# Patient Record
Sex: Female | Born: 1990
Health system: Southern US, Community
[De-identification: ages and names within clinical notes are randomized; demographics above are authoritative.]

## PROBLEM LIST (undated history)

## (undated) ENCOUNTER — Emergency Department (HOSPITAL_BASED_OUTPATIENT_CLINIC_OR_DEPARTMENT_OTHER): Admission: EM | Payer: Medicaid Other | Source: Home / Self Care

## (undated) DIAGNOSIS — I10 Essential (primary) hypertension: Secondary | ICD-10-CM

## (undated) DIAGNOSIS — J302 Other seasonal allergic rhinitis: Secondary | ICD-10-CM

## (undated) DIAGNOSIS — N200 Calculus of kidney: Secondary | ICD-10-CM

## (undated) DIAGNOSIS — E119 Type 2 diabetes mellitus without complications: Secondary | ICD-10-CM

## (undated) HISTORY — PX: DILATION AND CURETTAGE OF UTERUS: SHX78

---
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
## 2010-01-08 ENCOUNTER — Emergency Department (HOSPITAL_BASED_OUTPATIENT_CLINIC_OR_DEPARTMENT_OTHER): Admission: EM | Admit: 2010-01-08 | Discharge: 2010-01-08 | Payer: Self-pay | Admitting: Emergency Medicine

## 2011-03-05 ENCOUNTER — Emergency Department (HOSPITAL_BASED_OUTPATIENT_CLINIC_OR_DEPARTMENT_OTHER)
Admission: EM | Admit: 2011-03-05 | Discharge: 2011-03-05 | Disposition: A | Discharge status: Home / Self Care | Payer: Self-pay | Attending: Emergency Medicine | Admitting: Emergency Medicine

## 2011-03-05 ENCOUNTER — Encounter: Payer: Self-pay | Admitting: *Deleted

## 2011-03-05 ENCOUNTER — Other Ambulatory Visit: Payer: Self-pay

## 2011-03-05 DIAGNOSIS — R079 Chest pain, unspecified: Secondary | ICD-10-CM | POA: Insufficient documentation

## 2011-03-05 DIAGNOSIS — F17200 Nicotine dependence, unspecified, uncomplicated: Secondary | ICD-10-CM | POA: Insufficient documentation

## 2011-03-05 DIAGNOSIS — N898 Other specified noninflammatory disorders of vagina: Secondary | ICD-10-CM | POA: Insufficient documentation

## 2011-03-05 DIAGNOSIS — N939 Abnormal uterine and vaginal bleeding, unspecified: Secondary | ICD-10-CM

## 2011-03-05 LAB — URINALYSIS, ROUTINE W REFLEX MICROSCOPIC
Ketones, ur: NEGATIVE mg/dL
Leukocytes, UA: NEGATIVE
Nitrite: NEGATIVE
Urobilinogen, UA: 1 mg/dL (ref 0.0–1.0)
pH: 6.5 (ref 5.0–8.0)

## 2011-03-05 LAB — COMPREHENSIVE METABOLIC PANEL
Alkaline Phosphatase: 45 U/L (ref 39–117)
BUN: 12 mg/dL (ref 6–23)
Calcium: 9.6 mg/dL (ref 8.4–10.5)
GFR calc Af Amer: >60 mL/min (ref >60)
GFR calc non Af Amer: >60 mL/min (ref >60)
Glucose, Bld: 103 mg/dL — ABNORMAL HIGH (ref 70–99)
Total Protein: 7 g/dL (ref 6.0–8.3)

## 2011-03-05 LAB — DIFFERENTIAL
Eosinophils Absolute: 0.2 10*3/uL (ref 0.0–0.7)
Eosinophils Relative: 3 % (ref 0–5)
Lymphs Abs: 4 10*3/uL (ref 0.7–4.0)
Monocytes Relative: 7 % (ref 3–12)

## 2011-03-05 LAB — CBC
HCT: 32.7 % — ABNORMAL LOW (ref 36.0–46.0)
Hemoglobin: 11.4 g/dL — ABNORMAL LOW (ref 12.0–15.0)
MCH: 30.5 pg (ref 26.0–34.0)
MCV: 87.4 fL (ref 78.0–100.0)
RBC: 3.74 MIL/uL — ABNORMAL LOW (ref 3.87–5.11)

## 2011-03-05 LAB — URINE MICROSCOPIC-ADD ON

## 2011-03-05 NOTE — ED Notes (Signed)
Pt states that she had chest pain on and off for a month. Also has abd pain for a week and is having vaginal bleeding for a month. Pt is on depo-provera that she started a month + one week ago

## 2011-03-05 NOTE — ED Provider Notes (Addendum)
History     Chief Complaint  Patient presents with  . Chest Pain   HPI Comments: Patient states chest pain off and on for a month, worse with smoking.  Also abd pain, vaginal bleeding for a month.  Started after receiving depo shot.  No urinary complaints.   Patient is a 20 y.o. female presenting with chest pain. The history is provided by the patient.  Chest Pain The chest pain began more  than 1 month ago. Chest pain occurs constantly. The chest pain is unchanged. Primary symptoms include abdominal pain. Pertinent negatives for primary symptoms include no fever, no shortness of breath, no cough, no nausea and no vomiting. She tried nothing for the symptoms. Risk factors include smoking/tobacco exposure.     History reviewed. No pertinent past medical history.  History reviewed. No pertinent past surgical history.  No family history on file.  History  Substance Use Topics  . Smoking status: Current Everyday Smoker  . Smokeless tobacco: Not on file  . Alcohol Use:     OB History    Grav Para Term Preterm Abortions TAB SAB Ect Mult Living                  Review of Systems  Constitutional: Negative for fever, chills and activity change.  HENT: Negative.   Respiratory: Negative for cough and shortness of breath.   Cardiovascular: Positive for chest pain.  Gastrointestinal: Positive for abdominal pain. Negative for nausea, vomiting, diarrhea and blood in stool.  Genitourinary: Positive for vaginal bleeding. Negative for dysuria.  All other systems reviewed and are negative.    Physical Exam  BP 135/79  Pulse 81  Temp(Src) 98.6 F (37 C) (Oral)  Resp 18  Ht 6' (1.829 m)  Wt 153 lb (69.4 kg)  BMI 20.75 kg/m2  SpO2 100%  Physical Exam  Constitutional: She is oriented to person, place, and time. She appears well-developed and well-nourished. No distress.  HENT:  Head: Normocephalic and atraumatic.  Neck: Normal range of motion. Neck supple.  Cardiovascular:  Normal rate, regular rhythm and normal heart sounds.  Exam reveals no gallop and no friction rub.   No murmur heard. Pulmonary/Chest: Effort normal and breath sounds normal. No respiratory distress. She has no wheezes. She has no rales. She exhibits no tenderness.  Abdominal: Soft. Bowel sounds are normal. She exhibits no distension. There is no tenderness. There is no rebound.  Musculoskeletal: Normal range of motion.  Neurological: She is alert and oriented to person, place, and time.  Skin: Skin is warm and dry. She is not diaphoretic.    ED Course  Procedures  MDM EKG show NSR @ 77 bpm.  No acute changes.  Labs unremarkable, patient appears very comfortable.  I suspect bleeding is due to the Depo shot.  Will discharge and follow up with primary provider if not improving in the next week.       Geoffery Lyons, MD 03/05/11 1610  Geoffery Lyons, MD 05/05/11 [phone]

## 2011-05-09 LAB — GC/CHLAMYDIA PROBE AMP, GENITAL
Chlamydia, DNA Probe: POSITIVE — AB
GC Probe Amp, Genital: NEGATIVE

## 2011-05-09 LAB — URINALYSIS, ROUTINE W REFLEX MICROSCOPIC
Glucose, UA: NEGATIVE
Ketones, ur: NEGATIVE
Nitrite: NEGATIVE
Specific Gravity, Urine: 1.024
pH: 7.5

## 2011-05-09 LAB — WET PREP, GENITAL
Trich, Wet Prep: NONE SEEN
Yeast Wet Prep HPF POC: NONE SEEN

## 2011-05-20 ENCOUNTER — Emergency Department (HOSPITAL_BASED_OUTPATIENT_CLINIC_OR_DEPARTMENT_OTHER)
Admission: EM | Admit: 2011-05-20 | Discharge: 2011-05-20 | Disposition: A | Discharge status: Home / Self Care | Payer: Self-pay | Attending: Emergency Medicine | Admitting: Emergency Medicine

## 2011-05-20 ENCOUNTER — Encounter (HOSPITAL_BASED_OUTPATIENT_CLINIC_OR_DEPARTMENT_OTHER): Payer: Self-pay | Admitting: *Deleted

## 2011-05-20 DIAGNOSIS — N949 Unspecified condition associated with female genital organs and menstrual cycle: Secondary | ICD-10-CM | POA: Insufficient documentation

## 2011-05-20 DIAGNOSIS — N938 Other specified abnormal uterine and vaginal bleeding: Secondary | ICD-10-CM | POA: Insufficient documentation

## 2011-05-20 DIAGNOSIS — R079 Chest pain, unspecified: Secondary | ICD-10-CM | POA: Insufficient documentation

## 2011-05-20 LAB — URINE MICROSCOPIC-ADD ON

## 2011-05-20 LAB — URINALYSIS, ROUTINE W REFLEX MICROSCOPIC
Bilirubin Urine: NEGATIVE
Glucose, UA: NEGATIVE mg/dL
Ketones, ur: NEGATIVE mg/dL
Protein, ur: NEGATIVE mg/dL
pH: 7.5 (ref 5.0–8.0)

## 2011-05-20 NOTE — ED Provider Notes (Signed)
History     CSN: [id number] Arrival date & time: 05/20/2011 10:05 AM  Chief Complaint  Patient presents with  . Vaginal Bleeding    (Consider location/radiation/quality/duration/timing/severity/associated sxs/prior treatment) HPI Comments: Patient comes in today complaining of vaginal bleeding which is just light spotting over the last 3 months. She states she previously been receiving her gynecologic care at the [facility]. They had her on Depo-Provera until patient decided she did not want to take that anymore. Since stopping that she is still not had a regular period. Patient denies other vaginal discharge, fevers, nausea, vomiting, dysuria. She states that she's not currently sexually active. Patient also states she's had about 2 weeks of left-sided chest pain. It is not associated with any shortness of breath. It is not pleuritic in nature. Is not changed with ED and or associated with any nausea or vomiting or fevers. She's had no trauma.  Patient is a 20 y.o. female presenting with vaginal bleeding. The history is provided by the patient. No language interpreter was used.  Vaginal Bleeding This is a chronic problem. The problem occurs constantly. The problem has not changed since onset.Associated symptoms include chest pain. Pertinent negatives include no abdominal pain, no headaches and no shortness of breath. The symptoms are aggravated by nothing. The symptoms are relieved by nothing.    History reviewed. No pertinent past medical history.  History reviewed. No pertinent past surgical history.  No family history on file.  History  Substance Use Topics  . Smoking status: Current Everyday Smoker  . Smokeless tobacco: Not on file  . Alcohol Use: No    OB History    Grav Para Term Preterm Abortions TAB SAB Ect Mult Living                  Review of Systems  Constitutional: Negative.  Negative for fever and chills.  HENT: Negative.   Eyes: Negative.   Negative for discharge and redness.  Respiratory: Negative.  Negative for cough and shortness of breath.   Cardiovascular: Positive for chest pain.  Gastrointestinal: Negative.  Negative for nausea, vomiting, abdominal pain and diarrhea.  Genitourinary: Positive for vaginal bleeding. Negative for dysuria and vaginal discharge.  Musculoskeletal: Negative.  Negative for back pain.  Skin: Negative.  Negative for color change and rash.  Neurological: Negative.  Negative for syncope and headaches.  Hematological: Negative.  Negative for adenopathy.  Psychiatric/Behavioral: Negative.  Negative for confusion.  All other systems reviewed and are negative.    Allergies  Review of patient's allergies indicates no known allergies.  Home Medications  No current outpatient prescriptions on file.  BP 112/70  Pulse 72  Temp(Src) 99.1 F (37.3 C) (Oral)  Resp 18  SpO2 100%  Physical Exam  Constitutional: She is oriented to person, place, and time. She appears well-developed and well-nourished.  Non-toxic appearance. She does not have a sickly appearance.  HENT:  Head: Normocephalic and atraumatic.  Eyes: Conjunctivae, EOM and lids are normal. Pupils are equal, round, and reactive to light. No scleral icterus.  Neck: Trachea normal and normal range of motion. Neck supple.  Cardiovascular: Normal rate, regular rhythm and normal heart sounds.   Pulmonary/Chest: Effort normal and breath sounds normal. No respiratory distress. She has no wheezes. She has no rales. She exhibits no tenderness.  Abdominal: Soft. Normal appearance. There is no tenderness. There is no rebound, no guarding and no CVA tenderness.  Musculoskeletal: Normal range of motion.  Neurological: She is alert  and oriented to person, place, and time. She has normal strength.  Skin: Skin is warm, dry and intact. No rash noted.  Psychiatric: She has a normal mood and affect. Her behavior is normal. Judgment and thought content normal.      ED Course  Procedures (including critical care time)  Labs Reviewed  URINALYSIS, ROUTINE W REFLEX MICROSCOPIC - Abnormal; Notable for the following:    Hgb urine dipstick SMALL (*)    All other components within normal limits  URINE MICROSCOPIC-ADD ON - Abnormal; Notable for the following:    Squamous Epithelial / LPF FEW (*)    Bacteria, UA MANY (*)    All other components within normal limits  PREGNANCY, URINE   No results found.   No diagnosis found.    MDM  Patient with vaginal spotting over the last several months. She does not appear clinically anemic at this point in time. She denies any abdominal pain or vaginal discharge. States she's not currently sexually active and is not concerned that she has any sexual transmitted infections. She previously been on Depo-Provera and is now not had that in several months. Patient has not follow with a gynecologist but he previously been getting treated through the [facility]. Patient has been advised that she needs to follow back up with a gynecologist to be in a private office or the health department for her persistent bleeding and to determine if she wants to go back on some form of birth control. She's also been advised of the importance of annual Pap smears for cervical cancer screening. She otherwise appears well and does not have a positive pregnancy test. She declines a pelvic exam will be discharged home. She has no urinary symptoms at this point in time to indicate that she has significant UTI.        Nat Christen, MD 05/20/11 [phone]

## 2011-05-20 NOTE — ED Notes (Signed)
Patient states that she stopped her depo shots back in July and continues to have intermittent vaginal bleeding. Also c/o muscular pain under L breast, no injury

## 2011-05-20 NOTE — ED Notes (Signed)
Patient left after talking with MD and informed she was not pregnant & had request pregnancy test, no c/o pain prior to leaving

## 2012-10-22 ENCOUNTER — Encounter (HOSPITAL_BASED_OUTPATIENT_CLINIC_OR_DEPARTMENT_OTHER): Payer: Self-pay | Admitting: Emergency Medicine

## 2012-10-22 ENCOUNTER — Emergency Department (HOSPITAL_BASED_OUTPATIENT_CLINIC_OR_DEPARTMENT_OTHER)
Admission: EM | Admit: 2012-10-22 | Discharge: 2012-10-22 | Disposition: A | Discharge status: Home / Self Care | Payer: Self-pay | Attending: Emergency Medicine | Admitting: Emergency Medicine

## 2012-10-22 DIAGNOSIS — R42 Dizziness and giddiness: Secondary | ICD-10-CM | POA: Insufficient documentation

## 2012-10-22 DIAGNOSIS — Z3202 Encounter for pregnancy test, result negative: Secondary | ICD-10-CM | POA: Insufficient documentation

## 2012-10-22 DIAGNOSIS — R109 Unspecified abdominal pain: Secondary | ICD-10-CM | POA: Insufficient documentation

## 2012-10-22 DIAGNOSIS — R110 Nausea: Secondary | ICD-10-CM | POA: Insufficient documentation

## 2012-10-22 DIAGNOSIS — F17200 Nicotine dependence, unspecified, uncomplicated: Secondary | ICD-10-CM | POA: Insufficient documentation

## 2012-10-22 LAB — CBC WITH DIFFERENTIAL/PLATELET
Basophils Relative: 0 % (ref 0–1)
HCT: 34.3 % — ABNORMAL LOW (ref 36.0–46.0)
Hemoglobin: 11.8 g/dL — ABNORMAL LOW (ref 12.0–15.0)
Lymphs Abs: 3.1 10*3/uL (ref 0.7–4.0)
MCH: 30 pg (ref 26.0–34.0)
MCHC: 34.4 g/dL (ref 30.0–36.0)
Monocytes Absolute: 0.3 10*3/uL (ref 0.1–1.0)
Monocytes Relative: 7 % (ref 3–12)
Neutro Abs: 1.5 10*3/uL — ABNORMAL LOW (ref 1.7–7.7)
Neutrophils Relative %: 30 % — ABNORMAL LOW (ref 43–77)
RBC: 3.93 MIL/uL (ref 3.87–5.11)

## 2012-10-22 LAB — PREGNANCY, URINE: Preg Test, Ur: NEGATIVE

## 2012-10-22 LAB — URINALYSIS, ROUTINE W REFLEX MICROSCOPIC
Bilirubin Urine: NEGATIVE
Ketones, ur: NEGATIVE mg/dL
Nitrite: NEGATIVE
Protein, ur: NEGATIVE mg/dL

## 2012-10-22 LAB — URINE MICROSCOPIC-ADD ON

## 2012-10-22 NOTE — ED Notes (Signed)
D/c home- resources given- no new rx

## 2012-10-22 NOTE — ED Provider Notes (Signed)
History     CSN: [id number]  Arrival date & time 10/22/12  1751   First MD Initiated Contact with Patient 10/22/12 1801      Chief Complaint  Patient presents with  . Nausea  . Dizziness    (Consider location/radiation/quality/duration/timing/severity/associated sxs/prior treatment) HPI Comments: Patient is a 22 y/o F with no significant PMHx c/o nausea x 1 month that is intermittent. Patient reported having episodes of nausea throughout the day - lasting approximately 5-10 minutes with minimal abdominal pain - patient reported that once these episodes of nausea occur she "gags," but nothing comes up. Patient reported being sexually active, with males, last sexual encounter was 3 (three) days ago. Patient denied protection and use of contraception. Patient reported that LMP was September 15, 2012 - denied abnormal menstruation. Denied fever, vomiting, chills, headache, changes to appetite, diarrhea, constipation, recent illness, sick contacts, vaginal discharge, dysuria, dizziness upon change to position.    The history is provided by the patient. No language interpreter was used.    History reviewed. No pertinent past medical history.  History reviewed. No pertinent past surgical history.  No family history on file.  History  Substance Use Topics  . Smoking status: Current Every Day Smoker  . Smokeless tobacco: Not on file  . Alcohol Use: No    OB History   Grav Para Term Preterm Abortions TAB SAB Ect Mult Living                  Review of Systems  Constitutional: Negative for fever.  Gastrointestinal: Positive for nausea.  Genitourinary: Negative for vaginal discharge.  Neurological: Positive for dizziness.  10 Systems reviewed and are negative for acute change except as noted in the HPI.   Allergies  Review of patient's allergies indicates no known allergies.  Home Medications  No current outpatient prescriptions on file.  BP 133/80  Pulse 87  Temp(Src) 98.3  F (36.8 C) (Oral)  Resp 18  Ht 6\' 1"  (1.854 m)  Wt 183 lb (83.008 kg)  BMI 24.15 kg/m2  SpO2 100%  LMP 09/15/2012  Physical Exam  Nursing note and vitals reviewed. Constitutional: She is oriented to person, place, and time. She appears well-developed and well-nourished. No distress.  HENT:  Right Ear: External ear normal.  Left Ear: External ear normal.  Nose: Nose normal.  Mouth/Throat: Oropharynx is clear and moist. No oropharyngeal exudate.  Eyes: Conjunctivae and EOM are normal. Pupils are equal, round, and reactive to light. Right eye exhibits no discharge. Left eye exhibits no discharge.  Neck: Normal range of motion. Neck supple.  Negative lymphadenopathy.  Cardiovascular: Normal rate, regular rhythm, normal heart sounds and intact distal pulses.  Exam reveals no gallop.   No murmur heard. Pulmonary/Chest: Effort normal and breath sounds normal. She has no wheezes. She has no rales.  Abdominal: Soft. Bowel sounds are normal. She exhibits no distension. There is no tenderness. There is no rebound.  Musculoskeletal: Normal range of motion. She exhibits no edema.  Lymphadenopathy:    She has no cervical adenopathy.  Neurological: She is alert and oriented to person, place, and time.  Skin: Skin is warm and dry. No rash noted. She is not diaphoretic. No erythema.  Psychiatric: She has a normal mood and affect. Her behavior is normal.    ED Course  Procedures (including critical care time)  Labs Reviewed  URINALYSIS, ROUTINE W REFLEX MICROSCOPIC - Abnormal; Notable for the following:    Hgb urine dipstick MODERATE (*)  Leukocytes, UA SMALL (*)    All other components within normal limits  CBC WITH DIFFERENTIAL - Abnormal; Notable for the following:    Hemoglobin 11.8 (*)    HCT 34.3 (*)    Neutrophils Relative 30 (*)    Neutro Abs 1.5 (*)    Lymphocytes Relative 61 (*)    All other components within normal limits  PREGNANCY, URINE  URINE MICROSCOPIC-ADD ON    Results for orders placed during the hospital encounter of 10/22/12  PREGNANCY, URINE      Result Value Range   Preg Test, Ur NEGATIVE  NEGATIVE  URINALYSIS, ROUTINE W REFLEX MICROSCOPIC      Result Value Range   Color, Urine YELLOW  YELLOW   APPearance CLEAR  CLEAR   Specific Gravity, Urine 1.023  1.005 - 1.030   pH 6.5  5.0 - 8.0   Glucose, UA NEGATIVE  NEGATIVE mg/dL   Hgb urine dipstick MODERATE (*) NEGATIVE   Bilirubin Urine NEGATIVE  NEGATIVE   Ketones, ur NEGATIVE  NEGATIVE mg/dL   Protein, ur NEGATIVE  NEGATIVE mg/dL   Urobilinogen, UA 1.0  0.0 - 1.0 mg/dL   Nitrite NEGATIVE  NEGATIVE   Leukocytes, UA SMALL (*) NEGATIVE  URINE MICROSCOPIC-ADD ON      Result Value Range   Squamous Epithelial / LPF RARE  RARE   WBC, UA 3-6  <3 WBC/hpf   RBC / HPF 7-10  <3 RBC/hpf   Bacteria, UA RARE  RARE  CBC WITH DIFFERENTIAL      Result Value Range   WBC 5.0  4.0 - 10.5 K/uL   RBC 3.93  3.87 - 5.11 MIL/uL   Hemoglobin 11.8 (*) 12.0 - 15.0 g/dL   HCT 82.9 (*) 56.2 - 13.0 %   MCV 87.3  78.0 - 100.0 fL   MCH 30.0  26.0 - 34.0 pg   MCHC 34.4  30.0 - 36.0 g/dL   RDW 86.5  78.4 - 69.6 %   Platelets 212  150 - 400 K/uL   Neutrophils Relative 30 (*) 43 - 77 %   Neutro Abs 1.5 (*) 1.7 - 7.7 K/uL   Lymphocytes Relative 61 (*) 12 - 46 %   Lymphs Abs 3.1  0.7 - 4.0 K/uL   Monocytes Relative 7  3 - 12 %   Monocytes Absolute 0.3  0.1 - 1.0 K/uL   Eosinophils Relative 2  0 - 5 %   Eosinophils Absolute 0.1  0.0 - 0.7 K/uL   Basophils Relative 0  0 - 1 %   Basophils Absolute 0.0  0.0 - 0.1 K/uL    Filed Vitals:   10/22/12 1756  BP: 133/80  Pulse: 87  Temp: 98.3 F (36.8 C)  Resp: 18     1. Nausea   2. Dizziness       MDM  Patient is a 22 y/o with no significant PMHx c/o nausea x 1 month that is intermittent with episodes that last approximately 5-10 minutes with mild abdominal pain and feeling of gagging. Patient reported being sexually active, last sexual encounter  approximately 3 (three) days ago, denied protection and contraception. Patient reported LMP 09/15/2012, has not had menstruation for this month. Patient denied vomiting, fever, chills, weakness, changes to appetite, changes to bowel movements, vaginal discharge.   I personally evaluated and examined the patient.  PE: Eyes- PERRLA, conjunctiva without erythema, EOMs intact without pain b/l. Ears- TMs present without bulging, fluid accumulation, erythema, TM light reflex b/l.  Lungs- CTA b/l, no wheezes/rales/rhonchi. Cardiac- Regular rate/rhythm, S1/S2 heard, peripheral pulses palpable. Abdomen- Non-distended, BS normoactive, soft, non-tender to all 4 (four) quadrants.  Ordered Urine pregnancy test to r/o pregnancy Ordered UA to r/o UTI Ordered CBC to r/o anemia  Pregnancy test- negative UA- moderate blood, small leukocytes. Negative protein, ketones, nitrites. Reviewed patient's chart, blood found in urine 03/05/2011 Urine microscopic - negative findings CBC- Hgb low (11.8) and Hct low (34.3) - reviewed patient's chart and Hgb and Hct have always been mildly low, 03/05/2011 Hgb 11.7 and Hct 32.7 Results unchanged when compared to previous lab results in patient's chart.  Patient is afebrile, nontachycardic, normotensive, alert, happy affect. Patient aseptic. Negative urine pregnancy test. Discharged patient due to no sign of being septic. Discussed following up with physician, gave patient a list of recommendations for physicians to follow-up with regarding feelings of nausea that are reoccurring, dicussed with patient to follow-up within the week. Recommended over the counter Pepcid for nausea relief if nausea continued. Recommended patient to eat at least three (3) meals a day and to stay hydrated, patient reported only eating two (2) meals a day and mainly drinking soda. Referred patient to [facility], since patient is sexually active and does not have a gynecologist at the present time.  Discussed with patient to use protection during sexual encounters, discussed the different forms and recommended patient to discuss forms of contraception at [facility]. Discussed that if symptoms are to worsen (fver, abdominal pain, changes to bowel movements/appetite, vaginal pain, vaginal discharge) to report back to the ED.   Patient agreed to plan of care, understood, answered all questions.        Raymon Mutton, PA-C 10/22/12 2327

## 2012-10-22 NOTE — ED Notes (Signed)
Nausea approximately 3/7 days over the last month.  Also c/o feeling dizzy.  Denies H/A or CP.

## 2012-10-22 NOTE — ED Provider Notes (Signed)
  Medical screening examination/treatment/procedure(s) were performed by non-physician practitioner and as supervising physician I was immediately available for consultation/collaboration.    Gerhard Munch, MD 10/22/12 2358

## 2014-01-23 ENCOUNTER — Emergency Department (HOSPITAL_BASED_OUTPATIENT_CLINIC_OR_DEPARTMENT_OTHER)
Admission: EM | Admit: 2014-01-23 | Discharge: 2014-01-23 | Disposition: A | Discharge status: Home / Self Care | Payer: Medicaid Other | Attending: Emergency Medicine | Admitting: Emergency Medicine

## 2014-01-23 ENCOUNTER — Encounter (HOSPITAL_BASED_OUTPATIENT_CLINIC_OR_DEPARTMENT_OTHER): Payer: Self-pay | Admitting: Emergency Medicine

## 2014-01-23 DIAGNOSIS — F17200 Nicotine dependence, unspecified, uncomplicated: Secondary | ICD-10-CM | POA: Insufficient documentation

## 2014-01-23 DIAGNOSIS — R109 Unspecified abdominal pain: Secondary | ICD-10-CM

## 2014-01-23 DIAGNOSIS — R110 Nausea: Secondary | ICD-10-CM | POA: Insufficient documentation

## 2014-01-23 DIAGNOSIS — R1013 Epigastric pain: Secondary | ICD-10-CM | POA: Insufficient documentation

## 2014-01-23 DIAGNOSIS — Z3202 Encounter for pregnancy test, result negative: Secondary | ICD-10-CM | POA: Insufficient documentation

## 2014-01-23 DIAGNOSIS — Z79899 Other long term (current) drug therapy: Secondary | ICD-10-CM | POA: Insufficient documentation

## 2014-01-23 LAB — CBC WITH DIFFERENTIAL/PLATELET
BASOS PCT: 0 % (ref 0–1)
Basophils Absolute: 0 10*3/uL (ref 0.0–0.1)
EOS ABS: 0.1 10*3/uL (ref 0.0–0.7)
Eosinophils Relative: 3 % (ref 0–5)
HCT: 36.5 % (ref 36.0–46.0)
Hemoglobin: 12.4 g/dL (ref 12.0–15.0)
Lymphocytes Relative: 60 % — ABNORMAL HIGH (ref 12–46)
Lymphs Abs: 2.4 10*3/uL (ref 0.7–4.0)
MCH: 29.7 pg (ref 26.0–34.0)
MCHC: 34 g/dL (ref 30.0–36.0)
MCV: 87.5 fL (ref 78.0–100.0)
Monocytes Absolute: 0.3 10*3/uL (ref 0.1–1.0)
Monocytes Relative: 8 % (ref 3–12)
NEUTROS PCT: 29 % — AB (ref 43–77)
Neutro Abs: 1.2 10*3/uL — ABNORMAL LOW (ref 1.7–7.7)
PLATELETS: 256 10*3/uL (ref 150–400)
RBC: 4.17 MIL/uL (ref 3.87–5.11)
RDW: 13.5 % (ref 11.5–15.5)
WBC: 4 10*3/uL (ref 4.0–10.5)

## 2014-01-23 LAB — URINE MICROSCOPIC-ADD ON

## 2014-01-23 LAB — PREGNANCY, URINE: Preg Test, Ur: NEGATIVE

## 2014-01-23 LAB — COMPREHENSIVE METABOLIC PANEL
ALBUMIN: 4.3 g/dL (ref 3.5–5.2)
ALK PHOS: 50 U/L (ref 39–117)
ALT: 11 U/L (ref 0–35)
AST: 19 U/L (ref 0–37)
BUN: 8 mg/dL (ref 6–23)
CALCIUM: 9.6 mg/dL (ref 8.4–10.5)
CO2: 26 mEq/L (ref 19–32)
Chloride: 102 mEq/L (ref 96–112)
Creatinine, Ser: 0.9 mg/dL (ref 0.50–1.10)
GFR calc Af Amer: >90 mL/min (ref >90)
GFR calc non Af Amer: 89 mL/min — ABNORMAL LOW (ref >90)
Glucose, Bld: 114 mg/dL — ABNORMAL HIGH (ref 70–99)
POTASSIUM: 4.2 meq/L (ref 3.7–5.3)
SODIUM: 140 meq/L (ref 137–147)
TOTAL PROTEIN: 7.6 g/dL (ref 6.0–8.3)
Total Bilirubin: 0.4 mg/dL (ref 0.3–1.2)

## 2014-01-23 LAB — URINALYSIS, ROUTINE W REFLEX MICROSCOPIC
Bilirubin Urine: NEGATIVE
Glucose, UA: NEGATIVE mg/dL
KETONES UR: NEGATIVE mg/dL
NITRITE: NEGATIVE
PROTEIN: NEGATIVE mg/dL
Specific Gravity, Urine: 1.023 (ref 1.005–1.030)
UROBILINOGEN UA: 1 mg/dL (ref 0.0–1.0)
pH: 6.5 (ref 5.0–8.0)

## 2014-01-23 LAB — LIPASE, BLOOD: LIPASE: 26 U/L (ref 11–59)

## 2014-01-23 MED ORDER — GI COCKTAIL ~~LOC~~
30.0000 mL | Freq: Once | ORAL | Status: AC
  Administered 2014-01-23: 30 mL via ORAL
  Filled 2014-01-23: qty 30

## 2014-01-23 MED ORDER — FAMOTIDINE 20 MG PO TABS: 20.0000 mg | ORAL_TABLET | Freq: Two times a day (BID) | ORAL | Status: DC

## 2014-01-23 MED ORDER — ACETAMINOPHEN 325 MG PO TABS
650.0000 mg | ORAL_TABLET | Freq: Once | ORAL | Status: AC
  Administered 2014-01-23: 650 mg via ORAL
  Filled 2014-01-23: qty 2

## 2014-01-23 NOTE — Discharge Instructions (Signed)
Abdominal (belly) pain can be caused by many things. Your caregiver performed an examination and possibly ordered blood/urine tests and imaging (CT scan, x-rays, ultrasound). Many cases can be observed and treated at home after initial evaluation in the emergency department. Even though you are being discharged home, abdominal pain can be unpredictable. Therefore, you need a repeated exam if your pain does not resolve, returns, or worsens. Most patients with abdominal pain don't have to be admitted to the hospital or have surgery, but serious problems like appendicitis and gallbladder attacks can start out as nonspecific pain. Many abdominal conditions cannot be diagnosed in one visit, so follow-up evaluations are very important. SEEK IMMEDIATE MEDICAL ATTENTION IF: The pain does not go away or becomes severe.  A temperature above 101 develops.  Repeated vomiting occurs (multiple episodes).  The pain becomes localized to portions of the abdomen. The right side could possibly be appendicitis. In an adult, the left lower portion of the abdomen could be colitis or diverticulitis.  Blood is being passed in stools or vomit (bright red or black tarry stools).  Return also if you develop chest pain, difficulty breathing, dizziness or fainting, or become confused, poorly responsive, or inconsolable (young children).   Abdominal Pain Many things can cause belly (abdominal) pain. Most times, the belly pain is not dangerous. Many cases of belly pain can be watched and treated at home. HOME CARE   Do not take medicines that help you go poop (laxatives) unless told to by your doctor.  Only take medicine as told by your doctor.  Eat or drink as told by your doctor. Your doctor will tell you if you should be on a special diet. GET HELP IF:  You do not know what is causing your belly pain.  You have belly pain while you are sick to your stomach (nauseous) or have runny poop (diarrhea).  You have pain while  you pee or poop.  Your belly pain wakes you up at night.  You have belly pain that gets worse or better when you eat.  You have belly pain that gets worse when you eat fatty foods.  You have a fever. GET HELP RIGHT AWAY IF:   The pain does not go away within 2 hours.  You keep throwing up (vomiting).  The pain changes and is only in the right or left part of the belly.  You have bloody or tarry looking poop. MAKE SURE YOU:   Understand these instructions.  Will watch your condition.  Will get help right away if you are not doing well or get worse. Document Released: 01/09/2008 Document Revised: 07/28/2013 Document Reviewed: 04/01/2013 [facility] Patient Information 2015 [employer], [place]. This information is not intended to replace advice given to you by your health care provider. Make sure you discuss any questions you have with your health care provider.  Emergency Department Resource Guide 1) Find a Doctor and Pay Out of Pocket Although you won't have to find out who is covered by your insurance plan, it is a good idea to ask around and get recommendations. You will then need to call the office and see if the doctor you have chosen will accept you as a new patient and what types of options they offer for patients who are self-pay. Some doctors offer discounts or will set up payment plans for their patients who do not have insurance, but you will need to ask so you aren't surprised when you get to your appointment.  2)  Contact Your Local Health Department Not all health departments have doctors that can see patients for sick visits, but many do, so it is worth a call to see if yours does. If you don't know where your local health department is, you can check in your phone book. The CDC also has a tool to help you locate your state's health department, and many state websites also have listings of all of their local health departments.  3) Find a Walk-in Clinic If your illness is not  likely to be very severe or complicated, you may want to try a walk in clinic. These are popping up all over the country in pharmacies, drugstores, and shopping centers. They're usually staffed by nurse practitioners or physician assistants that have been trained to treat common illnesses and complaints. They're usually fairly quick and inexpensive. However, if you have serious medical issues or chronic medical problems, these are probably not your best option.  No Primary Care Doctor: - Call Health Connect at  [phone] - they can help you locate a primary care doctor that  accepts your insurance, provides certain services, etc. - Physician Referral Service- [phone]  Chronic Pain Problems: Organization         Address  Phone   Notes  [facility]  [phone] Patients need to be referred by their primary care doctor.   Medication Assistance: Organization         Address  Phone   Notes  [facility]ication [facility] [address] [place], [place] [address] [phone]) [phone] --Must be a resident of [place] -- Must have NO insurance coverage whatsoever (no Medicaid/ Medicare, etc.) -- The pt. MUST have a primary care doctor that directs their care regularly and follows them in the community   MedAssist  [phone]   [employer]  [phone]    Agencies that provide inexpensive medical care: Organization         Address  Phone   Notes  [facility]  [phone]   [facility]    [phone]   [facility] [address] [place], [place] [address] [phone]) [phone]   [facility] [address], [place] [phone]) [phone]   Planned Parenthood    [phone](336) [phone]   [facility]    [phone]   Community Health and [facility]  [address], [place] Phone:  [phone], Fax:  [phone](336) [phone] Hours of Operation:  9 am - 6 pm,  M-F.  Also accepts Medicaid/Medicare and self-pay.  [facility]  [address], [place] Phone: [phone]) [phone], Fax: [phone]) [phone]. Hours of Operation:  8:30 am - 5:30 pm, M-F.  Also accepts Medicaid and self-pay.  [facility] [place] [address], [place] Phone: [phone]) [phone]   [facility] [address], [place], [place] [phone], Ext. [phone] Mondays & Thursdays: 7-9 AM.  First 15 patients are seen on a first come, first serve basis.    Medicaid-accepting [facility] Providers:  Organization         Address  Phone   Notes  [facility] [address], [place] [phone]) [phone] Also accepts self-pay patients.  [facility] [address], [place]  [phone]   [facility] [address],  [place] [phone]   [place] [address], [place] [phone]   Lucianne Lei [address], [place]   [phone] Only accepts [place] Access [place] patients after they have their name applied to their card.   Self-Pay (no insurance) in [facility]:  Organization         Address  Phone   Notes  Sickle Cell Patients, [facility] [place] [phone]   [facility] [place] [phone]   [facility] [place]  [place], [place], [place] [phone]   [facility]/Dr. Osei-Bonsu  [address] [place], [place] or [address], [place] [phone] Phone number for both [place] and [place] locations is the same.  [facility] [address], [place] [phone]   [facility] [address], [place] or [address] [phone]   [facility] [address], [place] [phone], phone; [phone], fax Sees patients 1st and 3rd Saturday of every month.  Must not qualify for public or private insurance (i.e. Medicaid, Medicare, [place] Health Choice, Veterans' Benefits)  Household income should be no more than 200% of the poverty level The clinic cannot treat you if you are pregnant or think you are pregnant  Sexually transmitted diseases are not treated at the clinic.    Dental Care: Organization         Address  Phone  Notes  [facility] [place] [phone] Accepts children up to age 45 who are enrolled in [place] or [place]; pregnant women with a Medicaid card; and children who have applied for Medicaid or [place] Health Choice, but were declined, whose parents can pay a reduced fee at time of service.  [facility] Department of [facility]  [address], [place] [phone] Accepts children up to age 71 who are enrolled in [place] or [place]; pregnant women with a Medicaid card; and children who have applied for Medicaid or [place] Health Choice, but were declined, whose parents can pay a reduced fee at time of service.  [place] Adult Dental Access PROGRAM  Industry [phone] Patients are seen by appointment only. Walk-ins are not accepted. [place] will see patients 66 years of age and older. Monday - Tuesday (8am-5pm) Most Wednesdays (8:30-5pm) $30 per visit, cash only  [facility] Adult Dental Access PROGRAM  [address], [place] [phone] Patients are seen by appointment only. Walk-ins are not accepted. [place] will see patients 16 years of age and older. One Wednesday Evening (Monthly: Volunteer Based).  $30 per visit, cash only  [place]  [phone] for adults; Children under age 73, call Graduate Pediatric Dentistry at [phone]. Children aged 81-14, please call [phone] to request a pediatric application.  Dental services are provided in all areas of dental care including fillings, crowns and bridges, complete and partial dentures, implants, gum treatment, root canals, and extractions. Preventive care is also provided. Treatment is provided to both adults and children. Patients are selected via a lottery and there is often a waiting list.   [facility] [address], [place]  [phone] [email or web address]   [facility] [address] [place], [place] [phone], Ext. [phone] Second and Fourth Thursday of each month, opens at 6:30 AM; Clinic ends at 9 AM.  Patients are seen on a first-come first-served basis, and a limited number are seen during each clinic.   [facility]  [address] Hillard Danker [place], [place] [phone]   Eligibility Requirements You must have lived in [place], [place], or [place] counties for at least the last three months.   You cannot be eligible for state or federal sponsored [employer], including [employer], [place], or [employer].   You generally cannot be eligible for healthcare insurance through your employer.    How to apply: Eligibility screenings are held every Tuesday and Wednesday afternoon from 1:00 pm until 4:00 pm. You do not need an appointment for the interview!  [facility] [address], [place], [place]   [place]  [place] Department  [place]  [phone]    Behavioral Health Resources in the Community: Intensive Outpatient Programs Organization         Address  Phone  Notes  [place]. [address] [place], [place] [phone]   [facility] Outpatient [address] [place], [place]   ADS: Alcohol & Drug Svcs  [address], [place], [place]   [place] [address],  [place], [place] or [phone]   Substance Abuse Resources Organization         Address  Phone  Notes  Alcohol and Drug Services  [phone]   [place]  [phone]   The [place]   Chinita Pester  [phone]   Residential & Outpatient Substance Abuse Program  [phone]   Psychological Services Organization         Address  Phone  Notes  [facility] [place]  [place]  [phone]   [place] [address], [place] or [phone]    Mobile Crisis Teams Organization         Address  Phone  Notes  [facility]rapeutic [facility]atives, Mobile Crisis Care Unit  [phone]   [facility]otherapeutic Services  [address] [place], [place]   [place] Levels [address] [place] [phone]    Self-Help/Support Groups Organization         Address  Phone             Notes  [place]. of [place] - variety of support groups  [place] Call for more information  Narcotics Anonymous (NA), Caring Services [address], [employer] [place]  2 meetings at this location   Special educational needs teacher         Address  Phone  Notes  [facility]idential [facility] [place],    [place]  [phone]   [facility]  [address] [place] 676720, [place], [place]   [place], [place] [phone] Admissions: 8am-3pm M-F  Incentives Substance [place] [address]    [place], [place] [phone]   [facility] [address] Jadene Pierini [place], [place]   [facility] [address],  [place], [place]   Insight Programs -  Intensive Outpatient [address], [place], [place]  [phone]   [facility] ([place] [place].,  [place], [place] [phone] or [phone]   [facility] (RTS) [address], [place], [place] Accepts Medicaid  [facility] [place] [address],  [place] [phone] Substance Abuse/Addiction Treatment   [facility] Organization         Address  Phone  Notes  [facility]  [phone]   Domenic Schwab, PhD [address]lis Porta [place], [place]   [phone] or [phone]   [place], [place] [phone]   [address], [place], [place] [phone] Insurance/Medicaid/sponsorship through [facility] and Families [address]                                    [place], [place] [phone] [place] [address], [place] [phone]    Dr. Adele Schilder  [phone]   [facility] Dept. 1) [address] [place] 2) [place] 3)  [place] [address], [place] [phone]  [phone]   [place] [phone] or [phone] (After Hours)

## 2014-01-23 NOTE — ED Provider Notes (Signed)
CSN: [id number]     Arrival date & time 01/23/14  0808 History   First MD Initiated Contact with Patient 01/23/14 [phone]     Chief Complaint  Patient presents with  . Abdominal Pain     (Consider location/radiation/quality/duration/timing/severity/associated sxs/prior Treatment) HPI Comments: Anders SimmondsBriana Bonanno is a 23 y.o. Female with no significant PMHx who presents to the ED today complaining of 8/10 sharp epigastric abd pain that began when she awoke at 7am. Pain does not radiate. Holding her abdomen helps relieve the pain, no known aggravating factors. States she felt nauseas but did not vomit. Last BM was this AM, non-bloody and describes it as "her normal". Last meal was [employer] last night around 8pm. No known sick contacts, no recent travel, no recent NSAID use. Denies HA, dizziness, CP, SOB, diarrhea/constipation, myalgias, arthralgias, vaginal discharge/bleeding, hematuria or dysuria. Endorses sexual activity, one partner, and uses condoms. UTD on all vaccinations.   Patient is a 23 y.o. female presenting with abdominal pain. The history is provided by the patient. No language interpreter was used.  Abdominal Pain Pain location:  Epigastric Pain quality: sharp   Pain radiates to:  Does not radiate Pain severity:  Moderate (8/10) Onset quality:  Sudden Duration:  2 hours Timing:  Constant Progression:  Unchanged Chronicity:  New Context: not alcohol use, not diet changes and not sick contacts   Relieved by: "holding it" (in reference to her stomach) Worsened by:  Nothing tried Ineffective treatments:  None tried Associated symptoms: nausea   Associated symptoms: no anorexia, no chest pain, no chills, no constipation, no cough, no diarrhea, no dysuria, no fever, no hematemesis, no hematochezia, no hematuria, no melena, no shortness of breath, no sore throat, no vaginal bleeding, no vaginal discharge and no vomiting   Risk factors: no alcohol abuse     History reviewed. No  pertinent past medical history. History reviewed. No pertinent past surgical history. History reviewed. No pertinent family history. History  Substance Use Topics  . Smoking status: Current Every Day Smoker  . Smokeless tobacco: Not on file  . Alcohol Use: No   OB History   Grav Para Term Preterm Abortions TAB SAB Ect Mult Living                 Review of Systems  Constitutional: Negative for fever, chills and activity change.  HENT: Negative for mouth sores, rhinorrhea and sore throat.   Respiratory: Negative for cough and shortness of breath.   Cardiovascular: Negative for chest pain.  Gastrointestinal: Positive for nausea and abdominal pain. Negative for vomiting, diarrhea, constipation, blood in stool, melena, hematochezia, abdominal distention, rectal pain, anorexia and hematemesis.  Genitourinary: Negative for dysuria, urgency, hematuria, flank pain, vaginal bleeding, vaginal discharge, difficulty urinating, vaginal pain and pelvic pain.  Musculoskeletal: Negative for arthralgias, back pain and myalgias.  Skin: Negative for rash.  Neurological: Negative for dizziness and light-headedness.  All other systems reviewed and are negative.     Allergies  Review of patient's allergies indicates no known allergies.  Home Medications   Prior to Admission medications   Medication Sig Start Date End Date Taking? Authorizing Shearon Clonch  famotidine (PEPCID) 20 MG tablet Take 1 tablet (20 mg total) by mouth 2 (two) times daily. 01/23/14   Mercedes Strupp Camprubi-Soms, PA-C   BP 124/87  Pulse 86  Temp(Src) 97.8 F (36.6 C) (Oral)  Resp 24  Ht 6\' 1"  (1.854 m)  Wt 191 lb (86.637 kg)  BMI 25.20 kg/m2  SpO2 99%  LMP 01/09/2014 Physical Exam  Nursing note and vitals reviewed. Constitutional: She is oriented to person, place, and time. Vital signs are normal. She appears well-developed and well-nourished. No distress.  WDWN AAF in NAD, non-toxic appearing  HENT:  Head:  Normocephalic and atraumatic.  Mouth/Throat: Oropharynx is clear and moist and mucous membranes are normal.  MMM  Eyes: Conjunctivae and EOM are normal. No scleral icterus.  Neck: Normal range of motion. Neck supple.  Cardiovascular: Normal rate, regular rhythm and normal heart sounds.   Pulmonary/Chest: Effort normal and breath sounds normal. She has no wheezes. She has no rales.  Abdominal: Soft. Normal appearance and bowel sounds are normal. She exhibits no distension. There is tenderness in the epigastric area. There is no rigidity, no rebound, no guarding, no CVA tenderness, no tenderness at McBurney's point and negative Murphy's sign.  Mild epigastric TTP, +BS throughout, no rebound/guarding/rigidity. Negative CVA tenderness, Negative McBurney's, Negative Murphy's  Musculoskeletal: Normal range of motion.  Neurological: She is alert and oriented to person, place, and time.  Skin: Skin is warm and dry. No rash noted.  Psychiatric: She has a normal mood and affect.    ED Course  Procedures (including critical care time) Labs Review Labs Reviewed  URINALYSIS, ROUTINE W REFLEX MICROSCOPIC - Abnormal; Notable for the following:    APPearance CLOUDY (*)    Hgb urine dipstick MODERATE (*)    Leukocytes, UA MODERATE (*)    All other components within normal limits  CBC WITH DIFFERENTIAL - Abnormal; Notable for the following:    Neutrophils Relative % 29 (*)    Neutro Abs 1.2 (*)    Lymphocytes Relative 60 (*)    All other components within normal limits  COMPREHENSIVE METABOLIC PANEL - Abnormal; Notable for the following:    Glucose, Bld 114 (*)    GFR calc non Af Amer 89 (*)    All other components within normal limits  URINE MICROSCOPIC-ADD ON - Abnormal; Notable for the following:    Squamous Epithelial / LPF MANY (*)    Bacteria, UA MANY (*)    Casts HYALINE CASTS (*)    Crystals CA OXALATE CRYSTALS (*)    All other components within normal limits  PREGNANCY, URINE  LIPASE,  BLOOD    Imaging Review No results found.   EKG Interpretation None      MDM   Final diagnoses:  Abdominal pain, acute    Anders SimmondsBriana Plasse is a 23 y.o. female presenting to the ED with 2 hrs of sharp epigastric abd pain. DDx includes GERD, PUD, pancreatitis, or early gastroenteritis. Unlikely urinary or vaginal in etiology, will obtain preliminary labs and give GI cocktail and re-evaluate.  9:50 AM Labs unremarkable, U/A suspicious for contaminated specimen, no UTI concern at this time. No TTP or RUQ tenderness on recheck. Do not feel U/S is necessary at this time. I believe this is mild gastric irritation. Pt feels improved after observation and/or treatment in ED. Send home with Rx for Pepcid. Return precautions given. Stable for d/c  BP 124/87  Pulse 86  Temp(Src) 97.8 F (36.6 C) (Oral)  Resp 24  Ht 6\' 1"  (1.854 m)  Wt 191 lb (86.637 kg)  BMI 25.20 kg/m2  SpO2 99%  LMP 01/09/2014   Donnita FallsMercedes Strupp Camprubi-Soms, PA-C 01/23/14 [phone]

## 2014-01-23 NOTE — ED Notes (Signed)
Patient had a sharp pain in her abd this AM. States it was centrally located, denies N/V/D. Denies recent illness

## 2014-01-25 NOTE — ED Provider Notes (Signed)
Medical screening examination/treatment/procedure(s) were performed by non-physician practitioner and as supervising physician I was immediately available for consultation/collaboration.   EKG Interpretation None       Kathryn HornJohn M Bednar, MD 01/25/14 1422

## 2014-04-13 ENCOUNTER — Emergency Department (HOSPITAL_BASED_OUTPATIENT_CLINIC_OR_DEPARTMENT_OTHER)
Admission: EM | Admit: 2014-04-13 | Discharge: 2014-04-13 | Discharge status: Against Medical Advice | Payer: Medicaid Other | Attending: Emergency Medicine | Admitting: Emergency Medicine

## 2014-04-13 ENCOUNTER — Encounter (HOSPITAL_BASED_OUTPATIENT_CLINIC_OR_DEPARTMENT_OTHER): Payer: Self-pay | Admitting: Emergency Medicine

## 2014-04-13 DIAGNOSIS — R110 Nausea: Secondary | ICD-10-CM | POA: Diagnosis not present

## 2014-04-13 DIAGNOSIS — F17200 Nicotine dependence, unspecified, uncomplicated: Secondary | ICD-10-CM | POA: Diagnosis not present

## 2014-04-13 DIAGNOSIS — R42 Dizziness and giddiness: Secondary | ICD-10-CM | POA: Diagnosis not present

## 2014-04-13 HISTORY — DX: Other seasonal allergic rhinitis: J30.2

## 2014-04-13 LAB — PREGNANCY, URINE: Preg Test, Ur: NEGATIVE

## 2014-04-13 NOTE — ED Notes (Signed)
Episodes of dizziness and nausea x2 weeks.  Sts that when she drinks a lot of water it goes away.

## 2014-04-13 NOTE — ED Notes (Signed)
Called for pt again but no answer.

## 2014-04-13 NOTE — ED Notes (Signed)
Called for pt in [facility] but no answer. 

## 2014-04-13 NOTE — ED Notes (Signed)
Called for pt again but she cannot be found in [facility].

## 2014-05-04 ENCOUNTER — Emergency Department (HOSPITAL_BASED_OUTPATIENT_CLINIC_OR_DEPARTMENT_OTHER)
Admission: EM | Admit: 2014-05-04 | Discharge: 2014-05-04 | Disposition: A | Discharge status: Home / Self Care | Payer: Medicaid Other | Attending: Emergency Medicine | Admitting: Emergency Medicine

## 2014-05-04 ENCOUNTER — Encounter (HOSPITAL_BASED_OUTPATIENT_CLINIC_OR_DEPARTMENT_OTHER): Payer: Self-pay | Admitting: Emergency Medicine

## 2014-05-04 DIAGNOSIS — B9689 Other specified bacterial agents as the cause of diseases classified elsewhere: Secondary | ICD-10-CM | POA: Diagnosis not present

## 2014-05-04 DIAGNOSIS — O210 Mild hyperemesis gravidarum: Secondary | ICD-10-CM | POA: Diagnosis present

## 2014-05-04 DIAGNOSIS — O2390 Unspecified genitourinary tract infection in pregnancy, unspecified trimester: Secondary | ICD-10-CM | POA: Insufficient documentation

## 2014-05-04 DIAGNOSIS — Z9889 Other specified postprocedural states: Secondary | ICD-10-CM | POA: Diagnosis not present

## 2014-05-04 DIAGNOSIS — O99330 Smoking (tobacco) complicating pregnancy, unspecified trimester: Secondary | ICD-10-CM | POA: Diagnosis not present

## 2014-05-04 DIAGNOSIS — R0601 Orthopnea: Secondary | ICD-10-CM | POA: Insufficient documentation

## 2014-05-04 DIAGNOSIS — N760 Acute vaginitis: Secondary | ICD-10-CM | POA: Diagnosis not present

## 2014-05-04 DIAGNOSIS — R1013 Epigastric pain: Secondary | ICD-10-CM | POA: Diagnosis not present

## 2014-05-04 DIAGNOSIS — O9989 Other specified diseases and conditions complicating pregnancy, childbirth and the puerperium: Secondary | ICD-10-CM | POA: Insufficient documentation

## 2014-05-04 DIAGNOSIS — Z79899 Other long term (current) drug therapy: Secondary | ICD-10-CM | POA: Insufficient documentation

## 2014-05-04 DIAGNOSIS — A499 Bacterial infection, unspecified: Secondary | ICD-10-CM | POA: Insufficient documentation

## 2014-05-04 DIAGNOSIS — O219 Vomiting of pregnancy, unspecified: Secondary | ICD-10-CM

## 2014-05-04 LAB — URINALYSIS, ROUTINE W REFLEX MICROSCOPIC
Bilirubin Urine: NEGATIVE
Glucose, UA: NEGATIVE mg/dL
Hgb urine dipstick: NEGATIVE
KETONES UR: NEGATIVE mg/dL
NITRITE: NEGATIVE
PH: 8 (ref 5.0–8.0)
PROTEIN: NEGATIVE mg/dL
Specific Gravity, Urine: 1.022 (ref 1.005–1.030)
Urobilinogen, UA: 1 mg/dL (ref 0.0–1.0)

## 2014-05-04 LAB — WET PREP, GENITAL
Trich, Wet Prep: NONE SEEN
WBC WET PREP: NONE SEEN
YEAST WET PREP: NONE SEEN

## 2014-05-04 LAB — URINE MICROSCOPIC-ADD ON

## 2014-05-04 LAB — PREGNANCY, URINE: Preg Test, Ur: POSITIVE — AB

## 2014-05-04 MED ORDER — PROMETHAZINE HCL 25 MG PO TABS: 25.0000 mg | ORAL_TABLET | Freq: Four times a day (QID) | ORAL | Status: DC | PRN

## 2014-05-04 MED ORDER — CLINDAMYCIN HCL 150 MG PO CAPS: 150.0000 mg | ORAL_CAPSULE | Freq: Four times a day (QID) | ORAL | Status: DC

## 2014-05-04 NOTE — ED Notes (Signed)
Abdominal pain that feels like gas. She had 3 positive home pregnancy test. LMP August 20th.

## 2014-05-04 NOTE — ED Notes (Signed)
C/o upper abd pain x 1 hour w nausea,  Denies urinary sx

## 2014-05-04 NOTE — Discharge Instructions (Signed)
Start your prenatal care. Return as needed for problems.

## 2014-05-04 NOTE — ED Provider Notes (Signed)
CSN: [id number]     Arrival date & time 05/04/14  2017 History   First MD Initiated Contact with Patient 05/04/14 2029     Chief Complaint  Patient presents with  . Abdominal Pain     (Consider location/radiation/quality/duration/timing/severity/associated sxs/prior Treatment) Patient is a 23 y.o. female presenting with abdominal pain. The history is provided by the patient.  Abdominal Pain Pain location:  Epigastric Pain quality: sharp   Pain radiates to:  Does not radiate Pain severity:  Moderate Onset quality:  Sudden Duration:  1 hour Timing:  Intermittent Chronicity:  New Relieved by:  None tried Worsened by:  Nothing tried Ineffective treatments:  None tried Associated symptoms: nausea   Associated symptoms: no chest pain, no chills, no cough, no dysuria, no fever, no vaginal bleeding and no vaginal discharge   Kathryn Warren is a 23 y.o. G1 P0 who presents to the ED with abdominal pain that started approximately one hour prior to arrival to the ED. She describes the pain as feeling like a gas pain in the epigastric area. The pain has resolved. She does want to know if she is pregnant. LMP 03/25/2014, no birthcontrol, hx of GC 2013, Current sex partner x 3 months. [facility] and was normal. She was treated for BV at that visit.   Past Medical History  Diagnosis Date  . Seasonal allergies    Past Surgical History  Procedure Laterality Date  . Dilation and curettage of uterus     No family history on file. History  Substance Use Topics  . Smoking status: Current Every Day Smoker -- 1.00 packs/day  . Smokeless tobacco: Not on file  . Alcohol Use: Yes     Comment: rarely   OB History   Grav Para Term Preterm Abortions TAB SAB Ect Mult Living                 Review of Systems  Constitutional: Negative for fever and chills.  HENT: Negative.   Eyes: Negative for visual disturbance.  Respiratory: Negative for cough, chest  tightness and wheezing.   Cardiovascular: Negative for chest pain and palpitations.  Gastrointestinal: Positive for nausea and abdominal pain.  Genitourinary: Negative for dysuria, frequency, vaginal bleeding and vaginal discharge.  Musculoskeletal: Negative for back pain.  Skin: Negative for rash.  Neurological: Negative for dizziness, syncope and headaches.  Psychiatric/Behavioral: Negative for confusion. The patient is not nervous/anxious.       Allergies  Review of patient's allergies indicates no known allergies.  Home Medications   Prior to Admission medications   Medication Sig Start Date End Date Taking? Authorizing Provider  famotidine (PEPCID) 20 MG tablet Take 1 tablet (20 mg total) by mouth 2 (two) times daily. 01/23/14   Mercedes Strupp Camprubi-Soms, PA-C   BP 124/68  Pulse 83  Temp(Src) 98.2 F (36.8 C) (Oral)  Resp 20  Ht 6\' 1"  (1.854 m)  Wt 195 lb (88.451 kg)  BMI 25.73 kg/m2  SpO2 99%  LMP 03/25/2014 Physical Exam  Nursing note and vitals reviewed. Constitutional: She is oriented to person, place, and time. She appears well-developed and well-nourished.  HENT:  Head: Normocephalic.  Eyes: Conjunctivae and EOM are normal.  Neck: Neck supple.  Cardiovascular: Normal rate and regular rhythm.   Pulmonary/Chest: Effort normal and breath sounds normal.  Abdominal: Soft. Bowel sounds are normal. There is no tenderness.  Genitourinary:  External genitalia without lesions, mucous discharge vaginal vault. Cervix long,  closed, no CMT, no adnexal tenderness, uterus approximately 6 week size.   Musculoskeletal: Normal range of motion.  Neurological: She is alert and oriented to person, place, and time. No cranial nerve deficit.  Skin: Skin is warm and dry.  Psychiatric: She has a normal mood and affect. Her behavior is normal.    ED Course  Procedures (including critical care time) Labs Review Results for orders placed during the hospital encounter of 05/04/14  (from the past 24 hour(s))  URINALYSIS, ROUTINE W REFLEX MICROSCOPIC     Status: Abnormal   Collection Time    05/04/14  8:39 PM      Result Value Ref Range   Color, Urine YELLOW  YELLOW   APPearance TURBID (*) CLEAR   Specific Gravity, Urine 1.022  1.005 - 1.030   pH 8.0  5.0 - 8.0   Glucose, UA NEGATIVE  NEGATIVE mg/dL   Hgb urine dipstick NEGATIVE  NEGATIVE   Bilirubin Urine NEGATIVE  NEGATIVE   Ketones, ur NEGATIVE  NEGATIVE mg/dL   Protein, ur NEGATIVE  NEGATIVE mg/dL   Urobilinogen, UA 1.0  0.0 - 1.0 mg/dL   Nitrite NEGATIVE  NEGATIVE   Leukocytes, UA SMALL (*) NEGATIVE  PREGNANCY, URINE     Status: Abnormal   Collection Time    05/04/14  8:39 PM      Result Value Ref Range   Preg Test, Ur POSITIVE (*) NEGATIVE  URINE MICROSCOPIC-ADD ON     Status: Abnormal   Collection Time    05/04/14  8:39 PM      Result Value Ref Range   Squamous Epithelial / LPF FEW (*) RARE   WBC, UA 3-6  <3 WBC/hpf   Bacteria, UA FEW (*) RARE   Urine-Other AMORPHOUS URATES/PHOSPHATES    WET PREP, GENITAL     Status: Abnormal   Collection Time    05/04/14  9:17 PM      Result Value Ref Range   Yeast Wet Prep HPF POC NONE SEEN  NONE SEEN   Trich, Wet Prep NONE SEEN  NONE SEEN   Clue Cells Wet Prep HPF POC MODERATE (*) NONE SEEN   WBC, Wet Prep HPF POC NONE SEEN  NONE SEEN     MDM  23 y.o. female with nausea and epigastric pain that has resolved since arrival to the ED. Positive pregnancy test. No pain on abdominal or pelvic exam. Patient stable for discharge. She will follow up with OB for prenatal care. She will return here as needed for problems. Urine sent for culture, cultures for GC and Chlamydia pending. Other STI testing will be done by her OB.    Medication List    TAKE these medications       clindamycin 150 MG capsule  Commonly known as:  CLEOCIN  Take 1 capsule (150 mg total) by mouth every 6 (six) hours.     promethazine 25 MG tablet  Commonly known as:  PHENERGAN  Take 1  tablet (25 mg total) by mouth every 6 (six) hours as needed for nausea or vomiting.      ASK your doctor about these medications       famotidine 20 MG tablet  Commonly known as:  PEPCID  Take 1 tablet (20 mg total) by mouth 2 (two) times daily.           [place] View[place] M Ne[place], [place] 05/05/14 [phone]

## 2014-05-05 ENCOUNTER — Encounter (HOSPITAL_BASED_OUTPATIENT_CLINIC_OR_DEPARTMENT_OTHER): Payer: Self-pay | Admitting: Emergency Medicine

## 2014-05-05 ENCOUNTER — Emergency Department (HOSPITAL_BASED_OUTPATIENT_CLINIC_OR_DEPARTMENT_OTHER)
Admission: EM | Admit: 2014-05-05 | Discharge: 2014-05-05 | Disposition: A | Discharge status: Home / Self Care | Payer: Medicaid Other | Attending: Emergency Medicine | Admitting: Emergency Medicine

## 2014-05-05 DIAGNOSIS — Z79899 Other long term (current) drug therapy: Secondary | ICD-10-CM | POA: Diagnosis not present

## 2014-05-05 DIAGNOSIS — O210 Mild hyperemesis gravidarum: Secondary | ICD-10-CM | POA: Diagnosis present

## 2014-05-05 DIAGNOSIS — O2390 Unspecified genitourinary tract infection in pregnancy, unspecified trimester: Secondary | ICD-10-CM | POA: Diagnosis not present

## 2014-05-05 DIAGNOSIS — Z792 Long term (current) use of antibiotics: Secondary | ICD-10-CM | POA: Insufficient documentation

## 2014-05-05 DIAGNOSIS — O219 Vomiting of pregnancy, unspecified: Secondary | ICD-10-CM

## 2014-05-05 DIAGNOSIS — N390 Urinary tract infection, site not specified: Secondary | ICD-10-CM | POA: Insufficient documentation

## 2014-05-05 DIAGNOSIS — O99330 Smoking (tobacco) complicating pregnancy, unspecified trimester: Secondary | ICD-10-CM | POA: Diagnosis not present

## 2014-05-05 LAB — LIPASE, BLOOD: Lipase: 21 U/L (ref 11–59)

## 2014-05-05 LAB — COMPREHENSIVE METABOLIC PANEL
ALBUMIN: 3.7 g/dL (ref 3.5–5.2)
ALK PHOS: 42 U/L (ref 39–117)
ALT: 8 U/L (ref 0–35)
ANION GAP: 13 (ref 5–15)
AST: 13 U/L (ref 0–37)
BILIRUBIN TOTAL: 0.3 mg/dL (ref 0.3–1.2)
BUN: 6 mg/dL (ref 6–23)
CHLORIDE: 100 meq/L (ref 96–112)
CO2: 24 mEq/L (ref 19–32)
Calcium: 9.6 mg/dL (ref 8.4–10.5)
Creatinine, Ser: 0.8 mg/dL (ref 0.50–1.10)
GFR calc Af Amer: >90 mL/min (ref >90)
GFR calc non Af Amer: >90 mL/min (ref >90)
Glucose, Bld: 111 mg/dL — ABNORMAL HIGH (ref 70–99)
POTASSIUM: 4.2 meq/L (ref 3.7–5.3)
SODIUM: 137 meq/L (ref 137–147)
Total Protein: 7.1 g/dL (ref 6.0–8.3)

## 2014-05-05 LAB — CBC WITH DIFFERENTIAL/PLATELET
Basophils Absolute: 0 10*3/uL (ref 0.0–0.1)
Basophils Relative: 0 % (ref 0–1)
EOS PCT: 2 % (ref 0–5)
Eosinophils Absolute: 0.1 10*3/uL (ref 0.0–0.7)
HEMATOCRIT: 34.5 % — AB (ref 36.0–46.0)
Hemoglobin: 11.6 g/dL — ABNORMAL LOW (ref 12.0–15.0)
LYMPHS PCT: 47 % — AB (ref 12–46)
Lymphs Abs: 2.3 10*3/uL (ref 0.7–4.0)
MCH: 29.5 pg (ref 26.0–34.0)
MCHC: 33.6 g/dL (ref 30.0–36.0)
MCV: 87.8 fL (ref 78.0–100.0)
MONO ABS: 0.3 10*3/uL (ref 0.1–1.0)
Monocytes Relative: 7 % (ref 3–12)
Neutro Abs: 2.1 10*3/uL (ref 1.7–7.7)
Neutrophils Relative %: 44 % (ref 43–77)
Platelets: 243 10*3/uL (ref 150–400)
RBC: 3.93 MIL/uL (ref 3.87–5.11)
RDW: 13.6 % (ref 11.5–15.5)
WBC: 4.8 10*3/uL (ref 4.0–10.5)

## 2014-05-05 LAB — URINALYSIS, ROUTINE W REFLEX MICROSCOPIC
BILIRUBIN URINE: NEGATIVE
Glucose, UA: NEGATIVE mg/dL
Hgb urine dipstick: NEGATIVE
Ketones, ur: NEGATIVE mg/dL
NITRITE: NEGATIVE
PH: 7.5 (ref 5.0–8.0)
Protein, ur: NEGATIVE mg/dL
SPECIFIC GRAVITY, URINE: 1.012 (ref 1.005–1.030)
Urobilinogen, UA: 1 mg/dL (ref 0.0–1.0)

## 2014-05-05 LAB — GC/CHLAMYDIA PROBE AMP
CT Probe RNA: NEGATIVE
GC PROBE AMP APTIMA: NEGATIVE

## 2014-05-05 LAB — URINE MICROSCOPIC-ADD ON

## 2014-05-05 LAB — HCG, QUANTITATIVE, PREGNANCY: HCG, BETA CHAIN, QUANT, S: 22478 m[IU]/mL — AB (ref <5)

## 2014-05-05 MED ORDER — ONDANSETRON HCL 4 MG PO TABS: 4.0000 mg | ORAL_TABLET | Freq: Four times a day (QID) | ORAL | Status: DC

## 2014-05-05 MED ORDER — NITROFURANTOIN MONOHYD MACRO 100 MG PO CAPS: 100.0000 mg | ORAL_CAPSULE | Freq: Two times a day (BID) | ORAL | Status: DC

## 2014-05-05 MED ORDER — SODIUM CHLORIDE 0.9 % IV BOLUS (SEPSIS)
1000.0000 mL | Freq: Once | INTRAVENOUS | Status: AC
  Administered 2014-05-05: 1000 mL via INTRAVENOUS

## 2014-05-05 MED ORDER — ONDANSETRON HCL 4 MG/2ML IJ SOLN
4.0000 mg | Freq: Once | INTRAMUSCULAR | Status: AC
  Administered 2014-05-05: 4 mg via INTRAVENOUS
  Filled 2014-05-05: qty 2

## 2014-05-05 NOTE — ED Notes (Signed)
Pt c/o nausea last night and this am Seen here last night 22 weeks preg, reports phenergan tabs not working. Pt didn't not f/u with OBGYn

## 2014-05-05 NOTE — ED Provider Notes (Addendum)
CSN: [id number]     Arrival date & time 05/05/14  1443 History   First MD Initiated Contact with Patient 05/05/14 1458     Chief Complaint  Patient presents with  . Morning Sickness     (Consider location/radiation/quality/duration/timing/severity/associated sxs/prior Treatment) HPI Comments: Patient presents to the ER for evaluation of nausea. She reports that she has had persistent nausea without vomiting. There is no cough, congestion, abdominal pain, diarrhea. Patient was seen last night for epigastric abdominal pain, workup was negative. She was confirmed as pregnant. Patient has not had any vaginal bleeding. Patient currently denies any pain. She had a low-grade fever in triage, did not notice any fever prior to arrival.   Past Medical History  Diagnosis Date  . Seasonal allergies    Past Surgical History  Procedure Laterality Date  . Dilation and curettage of uterus     History reviewed. No pertinent family history. History  Substance Use Topics  . Smoking status: Current Every Day Smoker -- 1.00 packs/day  . Smokeless tobacco: Not on file  . Alcohol Use: Yes     Comment: rarely   OB History   Grav Para Term Preterm Abortions TAB SAB Ect Mult Living                 Review of Systems  Gastrointestinal: Positive for nausea. Negative for vomiting.  All other systems reviewed and are negative.     Allergies  Review of patient's allergies indicates no known allergies.  Home Medications   Prior to Admission medications   Medication Sig Start Date End Date Taking? Authorizing Provider  clindamycin (CLEOCIN) 150 MG capsule Take 1 capsule (150 mg total) by mouth every 6 (six) hours. 05/04/14   Hope Orlene Och, NP  famotidine (PEPCID) 20 MG tablet Take 1 tablet (20 mg total) by mouth 2 (two) times daily. 01/23/14   Mercedes Strupp Camprubi-Soms, PA-C  promethazine (PHENERGAN) 25 MG tablet Take 1 tablet (25 mg total) by mouth every 6 (six) hours as needed for nausea or  vomiting. 05/04/14   Hope Orlene Och, NP   BP 123/74  Pulse 94  Temp(Src) 100.4 F (38 C) (Oral)  Resp 16  Wt 195 lb (88.451 kg)  SpO2 98%  LMP 03/25/2014 Physical Exam  Constitutional: She is oriented to person, place, and time. She appears well-developed and well-nourished. No distress.  HENT:  Head: Normocephalic and atraumatic.  Right Ear: Hearing normal.  Left Ear: Hearing normal.  Nose: Nose normal.  Mouth/Throat: Oropharynx is clear and moist and mucous membranes are normal.  Eyes: Conjunctivae and EOM are normal. Pupils are equal, round, and reactive to light.  Neck: Normal range of motion. Neck supple.  Cardiovascular: Regular rhythm, S1 normal and S2 normal.  Exam reveals no gallop and no friction rub.   No murmur heard. Pulmonary/Chest: Effort normal and breath sounds normal. No respiratory distress. She exhibits no tenderness.  Abdominal: Soft. Normal appearance and bowel sounds are normal. There is no hepatosplenomegaly. There is no tenderness. There is no rebound, no guarding, no tenderness at McBurney's point and negative Murphy's sign. No hernia.  Musculoskeletal: Normal range of motion.  Neurological: She is alert and oriented to person, place, and time. She has normal strength. No cranial nerve deficit or sensory deficit. Coordination normal. GCS eye subscore is 4. GCS verbal subscore is 5. GCS motor subscore is 6.  Skin: Skin is warm, dry and intact. No rash noted. No cyanosis.  Psychiatric: She has a normal  mood and affect. Her speech is normal and behavior is normal. Thought content normal.    ED Course  Procedures (including critical care time) Labs Review Labs Reviewed  CBC WITH DIFFERENTIAL - Abnormal; Notable for the following:    Hemoglobin 11.6 (*)    HCT 34.5 (*)    Lymphocytes Relative 47 (*)    All other components within normal limits  HCG, QUANTITATIVE, PREGNANCY - Abnormal; Notable for the following:    hCG, Beta Chain, Quant, S [id number] (*)    All  other components within normal limits  URINALYSIS, ROUTINE W REFLEX MICROSCOPIC - Abnormal; Notable for the following:    APPearance CLOUDY (*)    Leukocytes, UA MODERATE (*)    All other components within normal limits  COMPREHENSIVE METABOLIC PANEL - Abnormal; Notable for the following:    Glucose, Bld 111 (*)    All other components within normal limits  URINE MICROSCOPIC-ADD ON - Abnormal; Notable for the following:    Squamous Epithelial / LPF FEW (*)    All other components within normal limits  LIPASE, BLOOD    Imaging Review No results found.   EKG Interpretation None      MDM   Final diagnoses:  None   UTI  Nausea  Patient presents to the ER for evaluation of nausea. Patient reports that she is experiencing morning sickness. She had a positive home pregnancy test and then was seen last May, had pregnancy confirmed. Patient was seen last night for epigastric abdominal pain which resolved spontaneously. She had any further abdominal pain. Patient reports nausea without vomiting.  Patient's vital signs were essentially normal except for low-grade fever, oral temperature 100.4. Patient had equivocal urinalysis yesterday, culture is still pending. GC and Chlamydia were negative. Patient does not appear to any pelvic pain. She is not experiencing any vaginal bleeding. There is no concern for ectopic. GC and chlamydia from yesterday were negative, as is not PID. Patient's urinalysis does look more consistent today with increased WBCs likely consistent with UTI. Remainder of the blood work was normal. I examined her abdomen 3 times serially here in the ER and she continues to be benign without any tenderness. Nausea has resolved with IV Zofran and fluids. No concern for gallbladder disease based on normal exam and normal LFTs.  Patient will be initiated on Macrobid and Zofran. Follow up with OB/GYN. Return for worsening symptoms.    Gilda Creasehristopher J. Pollina, MD 05/05/14  1653  Gilda Creasehristopher J. Pollina, MD 05/05/14 [phone]

## 2014-05-05 NOTE — Discharge Instructions (Signed)
Morning Sickness Morning sickness is when you feel sick to your stomach (nauseous) during pregnancy. This nauseous feeling may or may not come with vomiting. It often occurs in the morning but can be a problem any time of day. Morning sickness is most common during the first trimester, but it may continue throughout pregnancy. While morning sickness is unpleasant, it is usually harmless unless you develop severe and continual vomiting (hyperemesis gravidarum). This condition requires more intense treatment.  CAUSES  The cause of morning sickness is not completely known but seems to be related to normal hormonal changes that occur in pregnancy. RISK FACTORS You are at greater risk if you:  Experienced nausea or vomiting before your pregnancy.  Had morning sickness during a previous pregnancy.  Are pregnant with more than one baby, such as twins. TREATMENT  Do not use any medicines (prescription, over-the-counter, or herbal) for morning sickness without first talking to your health care provider. Your health care provider may prescribe or recommend:  Vitamin B6 supplements.  Anti-nausea medicines.  The herbal medicine ginger. HOME CARE INSTRUCTIONS   Only take over-the-counter or prescription medicines as directed by your health care provider.  Taking multivitamins before getting pregnant can prevent or decrease the severity of morning sickness in most women.  Eat a piece of dry toast or unsalted crackers before getting out of bed in the morning.  Eat five or six small meals a day.  Eat dry and bland foods (rice, baked potato). Foods high in carbohydrates are often helpful.  Do not drink liquids with your meals. Drink liquids between meals.  Avoid greasy, fatty, and spicy foods.  Get someone to cook for you if the smell of any food causes nausea and vomiting.  If you feel nauseous after taking prenatal vitamins, take the vitamins at night or with a snack.  Snack on protein  foods (nuts, yogurt, cheese) between meals if you are hungry.  Eat unsweetened gelatins for desserts.  Wearing an acupressure wristband (worn for sea sickness) may be helpful.  Acupuncture may be helpful.  Do not smoke.  Get a humidifier to keep the air in your house free of odors.  Get plenty of fresh air. SEEK MEDICAL CARE IF:   Your home remedies are not working, and you need medicine.  You feel dizzy or lightheaded.  You are losing weight. SEEK IMMEDIATE MEDICAL CARE IF:   You have persistent and uncontrolled nausea and vomiting.  You pass out (faint). MAKE SURE YOU:  Understand these instructions.  Will watch your condition.  Will get help right away if you are not doing well or get worse. Document Released: 09/13/2006 Document Revised: 07/28/2013 Document Reviewed: 01/07/2013 [facility] Patient Information 2015 [place]. This information is not intended to replace advice given to you by your health care provider. Make sure you discuss any questions you have with your health care provider.  Pregnancy and Urinary Tract Infection A urinary tract infection (UTI) is a bacterial infection of the urinary tract. Infection of the urinary tract can include the ureters, kidneys (pyelonephritis), bladder (cystitis), and urethra (urethritis). All pregnant women should be screened for bacteria in the urinary tract. Identifying and treating a UTI will decrease the risk of preterm labor and developing more serious infections in both the mother and baby. CAUSES Bacteria germs cause almost all UTIs.  RISK FACTORS Many factors can increase your chances of getting a UTI during pregnancy. These include:  Having a short urethra.  Poor toilet and hygiene habits.  Sexual intercourse.  Blockage of urine along the urinary tract.  Problems with the pelvic muscles or nerves.  Diabetes.  Obesity.  Bladder problems after having several children.  Previous history of UTI. SIGNS  AND SYMPTOMS   Pain, burning, or a stinging feeling when urinating.  Suddenly feeling the need to urinate right away (urgency).  Loss of bladder control (urinary incontinence).  Frequent urination, more than is common with pregnancy.  Lower abdominal or back discomfort.  Cloudy urine.  Blood in the urine (hematuria).  Fever. When the kidneys are infected, the symptoms may be:  Back pain.  Flank pain on the right side more so than the left.  Fever.  Chills.  Nausea.  Vomiting. DIAGNOSIS  A urinary tract infection is usually diagnosed through urine tests. Additional tests and procedures are sometimes done. These may include:  Ultrasound exam of the kidneys, ureters, bladder, and urethra.  Looking in the bladder with a lighted tube (cystoscopy). TREATMENT Typically, UTIs can be treated with antibiotic medicines.  HOME CARE INSTRUCTIONS   Only take over-the-counter or prescription medicines as directed by your health care provider. If you were prescribed antibiotics, take them as directed. Finish them even if you start to feel better.  Drink enough fluids to keep your urine clear or pale yellow.  Do not have sexual intercourse until the infection is gone and your health care provider says it is okay.  Make sure you are tested for UTIs throughout your pregnancy. These infections often come back. Preventing a UTI in the Future  Practice good toilet habits. Always wipe from front to back. Use the tissue only once.  Do not hold your urine. Empty your bladder as soon as possible when the urge comes.  Do not douche or use deodorant sprays.  Wash with soap and warm water around the genital area and the anus.  Empty your bladder before and after sexual intercourse.  Wear underwear with a cotton crotch.  Avoid caffeine and carbonated drinks. They can irritate the bladder.  Drink cranberry juice or take cranberry pills. This may decrease the risk of getting a  UTI.  Do not drink alcohol.  Keep all your appointments and tests as scheduled. SEEK MEDICAL CARE IF:   Your symptoms get worse.  You are still having fevers 2 or more days after treatment begins.  You have a rash.  You feel that you are having problems with medicines prescribed.  You have abnormal vaginal discharge. SEEK IMMEDIATE MEDICAL CARE IF:   You have back or flank pain.  You have chills.  You have blood in your urine.  You have nausea and vomiting.  You have contractions of your uterus.  You have a gush of fluid from the vagina. MAKE SURE YOU:  Understand these instructions.   Will watch your condition.   Will get help right away if you are not doing well or get worse.  Document Released: 11/17/2010 Document Revised: 05/13/2013 Document Reviewed: 02/19/2013 [facility] Patient Information 2015 [place], [place]. This information is not intended to replace advice given to you by your health care provider. Make sure you discuss any questions you have with your health care provider.

## 2014-05-06 LAB — URINE CULTURE: Colony Count: 60000

## 2014-05-06 NOTE — ED Provider Notes (Signed)
Medical screening examination/treatment/procedure(s) were performed by non-physician practitioner and as supervising physician I was immediately available for consultation/collaboration.   EKG Interpretation None        Khloei Spiker T Tramel Westbrook, MD 05/06/14 0048 

## 2014-05-12 ENCOUNTER — Emergency Department (HOSPITAL_BASED_OUTPATIENT_CLINIC_OR_DEPARTMENT_OTHER)
Admission: EM | Admit: 2014-05-12 | Discharge: 2014-05-12 | Disposition: A | Discharge status: Home / Self Care | Payer: Medicaid Other | Attending: Emergency Medicine | Admitting: Emergency Medicine

## 2014-05-12 ENCOUNTER — Encounter (HOSPITAL_BASED_OUTPATIENT_CLINIC_OR_DEPARTMENT_OTHER): Payer: Self-pay | Admitting: Emergency Medicine

## 2014-05-12 DIAGNOSIS — O210 Mild hyperemesis gravidarum: Secondary | ICD-10-CM | POA: Diagnosis present

## 2014-05-12 DIAGNOSIS — Z3A01 Less than 8 weeks gestation of pregnancy: Secondary | ICD-10-CM | POA: Diagnosis not present

## 2014-05-12 DIAGNOSIS — O219 Vomiting of pregnancy, unspecified: Secondary | ICD-10-CM

## 2014-05-12 DIAGNOSIS — Z792 Long term (current) use of antibiotics: Secondary | ICD-10-CM | POA: Diagnosis not present

## 2014-05-12 DIAGNOSIS — Z79899 Other long term (current) drug therapy: Secondary | ICD-10-CM | POA: Insufficient documentation

## 2014-05-12 DIAGNOSIS — Z87891 Personal history of nicotine dependence: Secondary | ICD-10-CM | POA: Diagnosis not present

## 2014-05-12 DIAGNOSIS — Z8709 Personal history of other diseases of the respiratory system: Secondary | ICD-10-CM | POA: Diagnosis not present

## 2014-05-12 LAB — URINALYSIS, ROUTINE W REFLEX MICROSCOPIC
Bilirubin Urine: NEGATIVE
Glucose, UA: NEGATIVE mg/dL
Hgb urine dipstick: NEGATIVE
Ketones, ur: 15 mg/dL — AB
Nitrite: NEGATIVE
PH: 7 (ref 5.0–8.0)
Protein, ur: 30 mg/dL — AB
SPECIFIC GRAVITY, URINE: 1.031 — AB (ref 1.005–1.030)
Urobilinogen, UA: 1 mg/dL (ref 0.0–1.0)

## 2014-05-12 LAB — URINE MICROSCOPIC-ADD ON

## 2014-05-12 MED ORDER — ONDANSETRON 8 MG PO TBDP
8.0000 mg | ORAL_TABLET | Freq: Once | ORAL | Status: AC
  Administered 2014-05-12: 8 mg via ORAL
  Filled 2014-05-12: qty 1

## 2014-05-12 MED ORDER — PROMETHAZINE HCL 25 MG PO TABS: 25.0000 mg | ORAL_TABLET | Freq: Four times a day (QID) | ORAL | Status: DC | PRN

## 2014-05-12 NOTE — ED Notes (Signed)
Pt able to eat [place], but was unable to eat popsicle. Pt refused further liquids. Pt encouraged to call when she can drink something.

## 2014-05-12 NOTE — ED Provider Notes (Signed)
CSN: [id number]     Arrival date & time 05/12/14  1126 History   First MD Initiated Contact with Patient 05/12/14 1206     Chief Complaint  Patient presents with  . Nausea     (Consider location/radiation/quality/duration/timing/severity/associated sxs/prior Treatment) Patient is a 23 y.o. female presenting with vomiting. The history is provided by the patient. No language interpreter was used.  Emesis Severity:  Mild Duration:  7 weeks Associated symptoms: no abdominal pain, no diarrhea and no myalgias   Associated symptoms comment:  She returns to the ED for persistent nausea, occurring most every day since earlier in her current pregnancy. She is approximately 7 weeks and has established with OB for prenatal care. She is taking Zofran at home and reports that "it only works sometime". No abdominal pain, fever, vaginal bleeding or discharge, dizziness or lightheadedness.    Past Medical History  Diagnosis Date  . Seasonal allergies    Past Surgical History  Procedure Laterality Date  . Dilation and curettage of uterus     No family history on file. History  Substance Use Topics  . Smoking status: Former Smoker -- 1.00 packs/day  . Smokeless tobacco: Not on file  . Alcohol Use: Yes     Comment: rarely   OB History   Grav Para Term Preterm Abortions TAB SAB Ect Mult Living                 Review of Systems  Constitutional: Negative for fever.  Respiratory: Negative for shortness of breath.   Gastrointestinal: Positive for nausea and vomiting. Negative for abdominal pain and diarrhea.  Genitourinary: Negative for dysuria, vaginal bleeding and vaginal discharge.  Musculoskeletal: Negative for myalgias.  Skin: Negative for color change.  Neurological: Negative for light-headedness.      Allergies  Review of patient's allergies indicates no known allergies.  Home Medications   Prior to Admission medications   Medication Sig Start Date End Date Taking? Authorizing  Provider  clindamycin (CLEOCIN) 150 MG capsule Take 1 capsule (150 mg total) by mouth every 6 (six) hours. 05/04/14  Yes Hope Orlene OchM Neese, NP  nitrofurantoin, macrocrystal-monohydrate, (MACROBID) 100 MG capsule Take 1 capsule (100 mg total) by mouth 2 (two) times daily. 05/05/14  Yes Gilda Creasehristopher J. Pollina, MD  ondansetron (ZOFRAN) 4 MG tablet Take 1 tablet (4 mg total) by mouth every 6 (six) hours. 05/05/14  Yes Gilda Creasehristopher J. Pollina, MD  famotidine (PEPCID) 20 MG tablet Take 1 tablet (20 mg total) by mouth 2 (two) times daily. 01/23/14   Mercedes Strupp Camprubi-Soms, PA-C  promethazine (PHENERGAN) 25 MG tablet Take 1 tablet (25 mg total) by mouth every 6 (six) hours as needed for nausea or vomiting. 05/04/14   Hope Orlene OchM Neese, NP   BP 132/69  Pulse 85  Temp(Src) 99 F (37.2 C) (Oral)  Resp 16  Ht 6\' 1"  (1.854 m)  Wt 199 lb (90.266 kg)  BMI 26.26 kg/m2  SpO2 98%  LMP 03/25/2014 Physical Exam  Constitutional: She is oriented to person, place, and time. She appears well-developed and well-nourished.  Well appearing, non-toxic.  HENT:  Mouth/Throat: Oropharynx is clear and moist.  Eyes: Conjunctivae are normal.  Neck: Normal range of motion.  Pulmonary/Chest: Effort normal.  Abdominal: Soft. She exhibits no distension. There is no tenderness.  Musculoskeletal: Normal range of motion.  Neurological: She is alert and oriented to person, place, and time.  Skin: Skin is warm and dry.  Psychiatric: She has a normal mood and  affect.    ED Course  Procedures (including critical care time) Labs Review Labs Reviewed  URINALYSIS, ROUTINE W REFLEX MICROSCOPIC   Results for orders placed during the hospital encounter of 05/12/14  URINALYSIS, ROUTINE W REFLEX MICROSCOPIC      Result Value Ref Range   Color, Urine AMBER (*) YELLOW   APPearance CLOUDY (*) CLEAR   Specific Gravity, Urine 1.031 (*) 1.005 - 1.030   pH 7.0  5.0 - 8.0   Glucose, UA NEGATIVE  NEGATIVE mg/dL   Hgb urine dipstick NEGATIVE   NEGATIVE   Bilirubin Urine NEGATIVE  NEGATIVE   Ketones, ur 15 (*) NEGATIVE mg/dL   Protein, ur 30 (*) NEGATIVE mg/dL   Urobilinogen, UA 1.0  0.0 - 1.0 mg/dL   Nitrite NEGATIVE  NEGATIVE   Leukocytes, UA LARGE (*) NEGATIVE  URINE MICROSCOPIC-ADD ON      Result Value Ref Range   Squamous Epithelial / LPF FEW (*) RARE   WBC, UA 7-10  <3 WBC/hpf   RBC / HPF 3-6  <3 RBC/hpf   Bacteria, UA MANY (*) RARE    Imaging Review No results found.   EKG Interpretation None      MDM   Final diagnoses:  None    1. Nausea of pregnancy without dehydration  Tolerating PO fluids without further vomiting in ED. She is taking Zofran at home with limited results. Will switch to phenergan. Return precautions discussed.    Arnoldo Hooker, PA-C 05/12/14 1405

## 2014-05-12 NOTE — ED Notes (Signed)
Pt drank 1/2 of 12 oz and ate crackers.

## 2014-05-12 NOTE — ED Provider Notes (Signed)
Medical screening examination/treatment/procedure(s) were performed by non-physician practitioner and as supervising physician I was immediately available for consultation/collaboration.    Linwood DibblesJon Olukemi Panchal, MD 05/12/14 1410

## 2014-05-12 NOTE — ED Notes (Signed)
Pt offered liquids and refuses all liquids. Pt given popsicle and icee.

## 2014-05-12 NOTE — ED Notes (Signed)
Pt requested saltine crackers and coke. Given.

## 2014-05-12 NOTE — Discharge Instructions (Signed)

## 2014-05-12 NOTE — ED Notes (Signed)
On left on and off abd cramps. Nausea x 7 wks and vomiting since Monday. States she is [redacted] weeks pregnant. No spotting or discharge.

## 2014-05-13 LAB — URINE CULTURE: Colony Count: 70000

## 2014-05-22 ENCOUNTER — Emergency Department (HOSPITAL_BASED_OUTPATIENT_CLINIC_OR_DEPARTMENT_OTHER)
Admission: EM | Admit: 2014-05-22 | Discharge: 2014-05-22 | Disposition: A | Discharge status: Home / Self Care | Payer: Medicaid Other | Attending: Emergency Medicine | Admitting: Emergency Medicine

## 2014-05-22 ENCOUNTER — Encounter (HOSPITAL_BASED_OUTPATIENT_CLINIC_OR_DEPARTMENT_OTHER): Payer: Self-pay | Admitting: Emergency Medicine

## 2014-05-22 DIAGNOSIS — Z3A08 8 weeks gestation of pregnancy: Secondary | ICD-10-CM | POA: Diagnosis not present

## 2014-05-22 DIAGNOSIS — Z87891 Personal history of nicotine dependence: Secondary | ICD-10-CM | POA: Diagnosis not present

## 2014-05-22 DIAGNOSIS — K5641 Fecal impaction: Secondary | ICD-10-CM

## 2014-05-22 DIAGNOSIS — O99611 Diseases of the digestive system complicating pregnancy, first trimester: Secondary | ICD-10-CM | POA: Diagnosis present

## 2014-05-22 DIAGNOSIS — Z79899 Other long term (current) drug therapy: Secondary | ICD-10-CM | POA: Insufficient documentation

## 2014-05-22 DIAGNOSIS — O2311 Infections of bladder in pregnancy, first trimester: Secondary | ICD-10-CM | POA: Diagnosis not present

## 2014-05-22 DIAGNOSIS — N3000 Acute cystitis without hematuria: Secondary | ICD-10-CM

## 2014-05-22 LAB — URINALYSIS, ROUTINE W REFLEX MICROSCOPIC
Glucose, UA: NEGATIVE mg/dL
KETONES UR: 15 mg/dL — AB
NITRITE: NEGATIVE
PH: 6 (ref 5.0–8.0)
Protein, ur: 30 mg/dL — AB
SPECIFIC GRAVITY, URINE: 1.035 — AB (ref 1.005–1.030)
UROBILINOGEN UA: 1 mg/dL (ref 0.0–1.0)

## 2014-05-22 LAB — URINE MICROSCOPIC-ADD ON

## 2014-05-22 MED ORDER — NITROFURANTOIN MONOHYD MACRO 100 MG PO CAPS: 100.0000 mg | ORAL_CAPSULE | Freq: Two times a day (BID) | ORAL | Status: DC

## 2014-05-22 MED ORDER — NITROFURANTOIN MONOHYD MACRO 100 MG PO CAPS
100.0000 mg | ORAL_CAPSULE | Freq: Once | ORAL | Status: AC
  Administered 2014-05-22: 100 mg via ORAL
  Filled 2014-05-22: qty 1

## 2014-05-22 NOTE — ED Provider Notes (Signed)
CSN: [id number]     Arrival date & time 05/22/14  0408 History   First MD Initiated Contact with Patient 05/22/14 [phone]     Chief Complaint  Patient presents with  . Abdominal Pain     (Consider location/radiation/quality/duration/timing/severity/associated sxs/prior Treatment) HPI This is a 23 year old female who is approximately [redacted] weeks pregnant. She has not had a bowel movement in the last 8 days. She got up to urinate this morning and felt pain in her lower abdomen and rectum when she urinated. Pain persists in her rectal area and is severe enough to make sitting difficult. She denies vaginal bleeding. She has had scant mucoid discharge.  Past Medical History  Diagnosis Date  . Seasonal allergies    Past Surgical History  Procedure Laterality Date  . Dilation and curettage of uterus     History reviewed. No pertinent family history. History  Substance Use Topics  . Smoking status: Former Smoker -- 1.00 packs/day  . Smokeless tobacco: Not on file  . Alcohol Use: Yes     Comment: rarely   OB History   Grav Para Term Preterm Abortions TAB SAB Ect Mult Living   1              Review of Systems  All other systems reviewed and are negative.     Allergies  Review of patient's allergies indicates no known allergies.  Home Medications   Prior to Admission medications   Medication Sig Start Date End Date Taking? Authorizing Provider  Prenatal Vit-Fe Fumarate-FA (MULTIVITAMIN-PRENATAL) 27-0.8 MG TABS tablet Take 1 tablet by mouth daily at 12 noon.   Yes Historical Provider, MD  clindamycin (CLEOCIN) 150 MG capsule Take 1 capsule (150 mg total) by mouth every 6 (six) hours. 05/04/14   Hope Orlene OchM Neese, NP  famotidine (PEPCID) 20 MG tablet Take 1 tablet (20 mg total) by mouth 2 (two) times daily. 01/23/14   Mercedes Strupp Camprubi-Soms, PA-C  nitrofurantoin, macrocrystal-monohydrate, (MACROBID) 100 MG capsule Take 1 capsule (100 mg total) by mouth 2 (two) times daily. 05/05/14    Gilda Creasehristopher J. Pollina, MD  ondansetron (ZOFRAN) 4 MG tablet Take 1 tablet (4 mg total) by mouth every 6 (six) hours. 05/05/14   Gilda Creasehristopher J. Pollina, MD  promethazine (PHENERGAN) 25 MG tablet Take 1 tablet (25 mg total) by mouth every 6 (six) hours as needed for nausea or vomiting. 05/04/14   Hope Orlene OchM Neese, NP  promethazine (PHENERGAN) 25 MG tablet Take 1 tablet (25 mg total) by mouth every 6 (six) hours as needed for nausea or vomiting. 05/12/14   Shari A Upstill, PA-C   BP 120/82  Pulse 95  Temp(Src) 98.7 F (37.1 C) (Oral)  Resp 18  Ht 6\' 1"  (1.854 m)  Wt 200 lb (90.719 kg)  BMI 26.39 kg/m2  SpO2 100%  LMP 03/25/2014  Physical Exam General: Well-developed, well-nourished female in no acute distress; appearance consistent with age of record HENT: normocephalic; atraumatic Eyes: pupils equal, round and reactive to light; extraocular muscles intact Neck: supple Heart: regular rate and rhythm; no murmurs, rubs or gallops Lungs: clear to auscultation bilaterally Abdomen: soft; nondistended; mild suprapubic tenderness; no masses or hepatosplenomegaly; bowel sounds present Rectal: Normal sphincter tone; fecal impaction Extremities: No deformity; full range of motion; pulses normal Neurologic: Awake, alert and oriented; motor function intact in all extremities and symmetric; no facial droop Skin: Warm and dry Psychiatric: Normal mood and affect    ED Course  Procedures (including critical care time)  MDM  6:21 AM Passage of large amount of stool following soadsuds enema.  Nursing notes and vitals signs, including pulse oximetry, reviewed.  Summary of this visit's results, reviewed by myself:  Labs:  Results for orders placed during the hospital encounter of 05/22/14 (from the past 24 hour(s))  URINALYSIS, ROUTINE W REFLEX MICROSCOPIC     Status: Abnormal   Collection Time    05/22/14  6:00 AM      Result Value Ref Range   Color, Urine AMBER (*) YELLOW   APPearance CLOUDY  (*) CLEAR   Specific Gravity, Urine 1.035 (*) 1.005 - 1.030   pH 6.0  5.0 - 8.0   Glucose, UA NEGATIVE  NEGATIVE mg/dL   Hgb urine dipstick TRACE (*) NEGATIVE   Bilirubin Urine SMALL (*) NEGATIVE   Ketones, ur 15 (*) NEGATIVE mg/dL   Protein, ur 30 (*) NEGATIVE mg/dL   Urobilinogen, UA 1.0  0.0 - 1.0 mg/dL   Nitrite NEGATIVE  NEGATIVE   Leukocytes, UA LARGE (*) NEGATIVE  URINE MICROSCOPIC-ADD ON     Status: Abnormal   Collection Time    05/22/14  6:00 AM      Result Value Ref Range   Squamous Epithelial / LPF MANY (*) RARE   WBC, UA 7-10  <3 WBC/hpf   RBC / HPF 3-6  <3 RBC/hpf   Bacteria, UA MANY (*) RARE   Urine-Other MUCOUS PRESENT        Hanley SeamenJohn L Kailen Name, MD 05/22/14 [phone]

## 2014-05-22 NOTE — ED Notes (Signed)
Denies vaginal bleeding or cramping, no nausea or vomiting

## 2014-05-22 NOTE — ED Notes (Signed)
Pt states that she awoke from her sleep to pee and felt sharp pain in her pelvic area, pain has resolved, but she has not had bm in at least a week, has not contacted her ob

## 2014-05-24 LAB — URINE CULTURE: Colony Count: >=100000

## 2014-05-30 ENCOUNTER — Emergency Department (HOSPITAL_BASED_OUTPATIENT_CLINIC_OR_DEPARTMENT_OTHER)
Admission: EM | Admit: 2014-05-30 | Discharge: 2014-05-30 | Disposition: A | Discharge status: Home / Self Care | Payer: Medicaid Other | Attending: Emergency Medicine | Admitting: Emergency Medicine

## 2014-05-30 ENCOUNTER — Encounter (HOSPITAL_BASED_OUTPATIENT_CLINIC_OR_DEPARTMENT_OTHER): Payer: Self-pay | Admitting: Emergency Medicine

## 2014-05-30 DIAGNOSIS — Z3A10 10 weeks gestation of pregnancy: Secondary | ICD-10-CM | POA: Insufficient documentation

## 2014-05-30 DIAGNOSIS — Z87891 Personal history of nicotine dependence: Secondary | ICD-10-CM | POA: Insufficient documentation

## 2014-05-30 DIAGNOSIS — Z79899 Other long term (current) drug therapy: Secondary | ICD-10-CM | POA: Diagnosis not present

## 2014-05-30 DIAGNOSIS — O210 Mild hyperemesis gravidarum: Secondary | ICD-10-CM | POA: Diagnosis present

## 2014-05-30 LAB — CBC WITH DIFFERENTIAL/PLATELET
BASOS PCT: 0 % (ref 0–1)
Basophils Absolute: 0 10*3/uL (ref 0.0–0.1)
Eosinophils Absolute: 0.1 10*3/uL (ref 0.0–0.7)
Eosinophils Relative: 1 % (ref 0–5)
HEMATOCRIT: 36.2 % (ref 36.0–46.0)
Hemoglobin: 12.4 g/dL (ref 12.0–15.0)
LYMPHS PCT: 43 % (ref 12–46)
Lymphs Abs: 1.9 10*3/uL (ref 0.7–4.0)
MCH: 29.9 pg (ref 26.0–34.0)
MCHC: 34.3 g/dL (ref 30.0–36.0)
MCV: 87.2 fL (ref 78.0–100.0)
MONO ABS: 0.4 10*3/uL (ref 0.1–1.0)
Monocytes Relative: 8 % (ref 3–12)
NEUTROS ABS: 2.2 10*3/uL (ref 1.7–7.7)
Neutrophils Relative %: 48 % (ref 43–77)
PLATELETS: 249 10*3/uL (ref 150–400)
RBC: 4.15 MIL/uL (ref 3.87–5.11)
RDW: 13.5 % (ref 11.5–15.5)
WBC: 4.5 10*3/uL (ref 4.0–10.5)

## 2014-05-30 LAB — BASIC METABOLIC PANEL
Anion gap: 15 (ref 5–15)
BUN: 7 mg/dL (ref 6–23)
CALCIUM: 9.9 mg/dL (ref 8.4–10.5)
CO2: 22 mEq/L (ref 19–32)
CREATININE: 0.7 mg/dL (ref 0.50–1.10)
Chloride: 101 mEq/L (ref 96–112)
GFR calc non Af Amer: >90 mL/min (ref >90)
Glucose, Bld: 89 mg/dL (ref 70–99)
Potassium: 4.1 mEq/L (ref 3.7–5.3)
SODIUM: 138 meq/L (ref 137–147)

## 2014-05-30 MED ORDER — PROMETHAZINE HCL 25 MG/ML IJ SOLN
12.5000 mg | Freq: Once | INTRAMUSCULAR | Status: AC
  Administered 2014-05-30: 12.5 mg via INTRAVENOUS
  Filled 2014-05-30: qty 1

## 2014-05-30 MED ORDER — PROMETHAZINE HCL 25 MG RE SUPP: 25.0000 mg | Freq: Three times a day (TID) | RECTAL | Status: DC | PRN

## 2014-05-30 MED ORDER — PROMETHAZINE HCL 25 MG PO TABS: 25.0000 mg | ORAL_TABLET | Freq: Four times a day (QID) | ORAL | Status: DC | PRN

## 2014-05-30 MED ORDER — SODIUM CHLORIDE 0.9 % IV BOLUS (SEPSIS)
2000.0000 mL | Freq: Once | INTRAVENOUS | Status: AC
  Administered 2014-05-30: 2000 mL via INTRAVENOUS

## 2014-05-30 NOTE — Discharge Instructions (Signed)
Phenergan as needed for nausea.  Benadryl 50 mg at night as needed to help you sleep.  Follow-up with your OB in the next few days to be rechecked.   Hyperemesis Gravidarum Hyperemesis gravidarum is a severe form of nausea and vomiting that happens during pregnancy. Hyperemesis is worse than morning sickness. It may cause you to have nausea or vomiting all day for many days. It may keep you from eating and drinking enough food and liquids. Hyperemesis usually occurs during the first half (the first 20 weeks) of pregnancy. It often goes away once a woman is in her second half of pregnancy. However, sometimes hyperemesis continues through an entire pregnancy.  CAUSES  The cause of this condition is not completely known but is thought to be related to changes in the body's hormones when pregnant. It could be from the high level of the pregnancy hormone or an increase in estrogen in the body.  SIGNS AND SYMPTOMS   Severe nausea and vomiting.  Nausea that does not go away.  Vomiting that does not allow you to keep any food down.  Weight loss and body fluid loss (dehydration).  Having no desire to eat or not liking food you have previously enjoyed. DIAGNOSIS  Your health care provider will do a physical exam and ask you about your symptoms. He or she may also order blood tests and urine tests to make sure something else is not causing the problem.  TREATMENT  You may only need medicine to control the problem. If medicines do not control the nausea and vomiting, you will be treated in the hospital to prevent dehydration, increased acid in the blood (acidosis), weight loss, and changes in the electrolytes in your body that may harm the unborn baby (fetus). You may need IV fluids.  HOME CARE INSTRUCTIONS   Only take over-the-counter or prescription medicines as directed by your health care provider.  Try eating a couple of dry crackers or toast in the morning before getting out of bed.  Avoid  foods and smells that upset your stomach.  Avoid fatty and spicy foods.  Eat 5-6 small meals a day.  Do not drink when eating meals. Drink between meals.  For snacks, eat high-protein foods, such as cheese.  Eat or suck on things that have ginger in them. Ginger helps nausea.  Avoid food preparation. The smell of food can spoil your appetite.  Avoid iron pills and iron in your multivitamins until after 3-4 months of being pregnant. However, consult with your health care provider before stopping any prescribed iron pills. SEEK MEDICAL CARE IF:   Your abdominal pain increases.  You have a severe headache.  You have vision problems.  You are losing weight. SEEK IMMEDIATE MEDICAL CARE IF:   You are unable to keep fluids down.  You vomit blood.  You have constant nausea and vomiting.  You have excessive weakness.  You have extreme thirst.  You have dizziness or fainting.  You have a fever or persistent symptoms for more than 2-3 days.  You have a fever and your symptoms suddenly get worse. MAKE SURE YOU:   Understand these instructions.  Will watch your condition.  Will get help right away if you are not doing well or get worse. Document Released: 07/23/2005 Document Revised: 05/13/2013 Document Reviewed: 03/04/2013 [facility] Patient Information 2015 [place]. This information is not intended to replace advice given to you by your health care provider. Make sure you discuss any questions you have  with your health care provider.    Emergency Department Resource Guide 1) Find a Doctor and Pay Out of Pocket Although you won't have to find out who is covered by your insurance plan, it is a good idea to ask around and get recommendations. You will then need to call the office and see if the doctor you have chosen will accept you as a new patient and what types of options they offer for patients who are self-pay. Some doctors offer discounts or will set up payment  plans for their patients who do not have insurance, but you will need to ask so you aren't surprised when you get to your appointment.  2) Contact Your Local Health Department Not all health departments have doctors that can see patients for sick visits, but many do, so it is worth a call to see if yours does. If you don't know where your local health department is, you can check in your phone book. The CDC also has a tool to help you locate your state's health department, and many state websites also have listings of all of their local health departments.  3) Find a Walk-in Clinic If your illness is not likely to be very severe or complicated, you may want to try a walk in clinic. These are popping up all over the country in pharmacies, drugstores, and shopping centers. They're usually staffed by nurse practitioners or physician assistants that have been trained to treat common illnesses and complaints. They're usually fairly quick and inexpensive. However, if you have serious medical issues or chronic medical problems, these are probably not your best option.  No Primary Care Doctor: - Call Health Connect at  [phone] - they can help you locate a primary care doctor that  accepts your insurance, provides certain services, etc. - Physician Referral Service- [phone]  Chronic Pain Problems: Organization         Address  Phone   Notes  [facility]  [phone] Patients need to be referred by their primary care doctor.   Medication Assistance: Organization         Address  Phone   Notes  [facility] Medication [facility] [address] [place], [place] [id number] [phone] --Must be a resident of [facility] -- Must have NO insurance coverage whatsoever (no Medicaid/ Medicare, etc.) -- The pt. MUST have a primary care doctor that directs their care regularly and follows them in the community   MedAssist  [phone]   [employer]   [phone]    Agencies that provide inexpensive medical care: Organization         Address  Phone   Notes  [facility]  [phone]   [facility]    [phone]   [facility] [address] [place], [place] [address] [phone]   [facility] [address], [place] [phone]) [phone]   [facility]    [phone]   [facility]    [phone]   [facility]  [address], [place] Phone:  [phone], Fax:  [phone] Hours of Operation:  9 am - 6 pm, M-F.  Also accepts Medicaid/Medicare and self-pay.  [facility]  [address], [place] Phone: [phone], Fax: [phone]. Hours of Operation:  8:30 am - 5:30 pm, M-F.  Also  accepts Medicaid and self-pay.  [facility] [place] [address] [place] Phone: [phone]   [facility] [address] Natasha Bence [place], [place] [phone], Ext. [phone] Mondays & Thursdays: 7-9 AM.  First 15 patients are seen on a first come, first serve basis.    Medicaid-accepting [facility] Providers:  Organization         Address  Phone   Notes  [facility] [address], [place] [phone] Also accepts self-pay patients.  [facility] [address] Laurell Josephs [place], [place]  [phone]   [facility] [address], [place] [phone]   [facility] [address], [place] [phone]   Renaye Rakers [address], [place]   [phone] Only accepts [place] Access [place] patients after they have their name applied to their card.   Self-Pay (no insurance) in [facility]:  Organization         Address  Phone   Notes  Sickle Cell Patients, [facility] [address] [place], [place] [phone]   [facility] [address] [place], [place] [phone]   [facility] [place]  [address],  [phone]   [facility]/Dr. Osei-Bonsu  [address], [place] or [address], [place] [phone] Phone number for both [place] and [place] locations is the same.  [facility] [address], [place] [phone]   [facility] [address] [place] or [address] [phone]   [facility] [address], [place] [phone], phone; [phone], fax Sees patients 1st and 3rd Saturday of every month.  Must not qualify for public or private insurance (i.e. Medicaid, Medicare, [place] Health Choice, Veterans' Benefits)  Household income should be no more than 200% of the poverty level The clinic cannot treat you if you are pregnant or think you are pregnant  Sexually transmitted diseases are not treated at the clinic.    Dental Care: Organization         Address  Phone  Notes  [facility] of [facility] [address] [place], [place] [phone] Accepts children up to age 31 who are enrolled in [place] or [place] Health Choice; pregnant women with a Medicaid card; and children who have applied for Medicaid or [place] Health Choice, but were declined, whose parents can pay a reduced fee at time of service.  [facility] Department of [facility]  [address], [place] [phone] Accepts children up to age 76 who are enrolled in [place] or [place] Health Choice; pregnant women with a Medicaid card; and children who have applied for Medicaid or [place] Health Choice, but were declined, whose parents can pay a reduced fee at time of service.  [facility]  [address] [place], [place] [phone] Patients are  seen by appointment only. Walk-ins are not accepted. [facility] will see patients 65 years of age and older. Monday - Tuesday (8am-5pm) Most Wednesdays (8:30-5pm) $30 per visit, cash only  [facility] Adult Dental Access PROGRAM  [address], [facility] [phone] Patients are seen by appointment only. Walk-ins are not accepted. [facility] will  see patients 83 years of age and older. One Wednesday Evening (Monthly: Volunteer Based).  $30 per visit, cash only  [employer]  [phone] for adults; Children under age 5, call Graduate Pediatric Dentistry at [phone]. Children aged 30-14, please call [phone] to request a pediatric application.  Dental services are provided in all areas of dental care including fillings, crowns and bridges, complete and partial dentures, implants, gum treatment, root canals, and extractions. Preventive care is also provided. Treatment is provided to both adults and children. Patients are selected via a lottery and there is often a waiting list.   [facility] [address], [place]  [phone] [email or web address]   [facility] [address] [place], [place] [phone], Ext. [phone] Second and Fourth Thursday of each month, opens at 6:30 AM; Clinic ends at 9 AM.  Patients are seen on a first-come first-served basis, and a limited number are seen during each clinic.   [facility]  [address] Ether Griffins [place], [place] [phone]   Eligibility Requirements You must have lived in [place], [place], or [place] counties for at least the last three months.   You cannot be eligible for state or federal sponsored [place], including [employer], [place], or [employer].   You generally cannot be eligible for healthcare insurance through your employer.    How to apply: Eligibility screenings are held every Tuesday and Wednesday afternoon from 1:00 pm until 4:00  pm. You do not need an appointment for the interview!  [facility] [address] [place], [place] [phone]   [facility] Health Department  [phone]   [facility] Health Department  [phone]   [facility] Health Department  [phone]    Behavioral Health Resources in the Community: Intensive Outpatient Programs Organization         Address  Phone  Notes  [facility] [address], [place], [place] [phone]   [facility] Outpatient [address] [place], [place] [phone]   [facility] [address] [place], [place]  [phone]   [facility] Mental Health [address]  [place], [place] [phone] or [phone]   Substance Abuse Resources Organization         Address  Phone  Notes  [facility] Drug Services  [phone]   [facility]  [phone]   [facility]  [phone]   Floydene Flock  [phone]   Residential & Outpatient Substance Abuse Program  [phone]   Psychological Services Organization         Address  Phone  Notes  [facility] Health  [phone]   [facility] Services  [phone]   [facility] Mental Health [address] [place] [phone] or [phone]    Mobile Crisis Teams Organization         Address  Phone  Notes  [facility], Mobile Crisis Care Unit  [phone]   [facility]  [address] [place], [place] [phone]   Doristine Locks [address] [place] [phone]    Self-Help/Support Groups Organization         Address  Phone             Notes  [facility] [place] - variety of support groups  336- I7437963 Call for more information  Narcotics Anonymous (NA), [facility] [address], [employer]  [place]  2 meetings at this location   Residential Treatment Programs Organization          Address  Phone  Notes  [facility] [address]    [place]  [phone]   [facility]  [address], [place] [address], [place], [place] [phone]   [facility] [address] [place], [place] [phone] Admissions: 8am-3pm M-F  [facility] [address],    [place], [place] [phone]   [facility] [address] [place], [place], [place] [phone]   [facility] [address],  [place], [place] [phone]   [facility] - Intensive Outpatient [address] Laurell Josephs [address], [place], [place] [phone]   [facility] (Addiction [facility] [address] [place].,  [place], [place] [phone] or [phone]   [facility] ([facility]) [address], [place], [place] [phone] Accepts Medicaid  [facility] [address]  [place] [phone] Substance Abuse/Addiction Treatment   [facility] Organization         Address  Phone  Notes  [facility]  [phone]   Angie Fava, PhD [address] Ervin Knack [place], [place]   [phone] or [phone]   [facility]al   [address] [place], [place] [phone]   [facility] [address] [place], [place] [phone] Insurance/Medicaid/sponsorship through [facility] and Families [address]                                    [place], [place] [phone] Therapy/tele-psych/case  [facility] [address], [place] [phone]    Dr. Lolly Mustache  [phone]   [facility]  [employer] [facility] Dept. 1) [address], [place] 2) [address], [place] 3)  [address], [place] [phone]  [phone]   [facility] Child Abuse Hotline [phone] or [phone] (After Hours)

## 2014-05-30 NOTE — ED Notes (Signed)
Reports vomiting x 2 weeks pt reports she is almost [redacted] weeks pregnant- second pregnancy, reports first preg miscarried at around 10 weeks

## 2014-05-30 NOTE — ED Provider Notes (Signed)
CSN: [id number]     Arrival date & time 10/25/[id number]  1548 History  This chart was scribed for Geoffery Lyonsouglas China Deitrick, MD by Roxy Cedarhandni Bhalodia, ED Scribe. This patient was seen in room MH01/MH01 and the patient's care was started at 4:11 PM.   Chief Complaint  Patient presents with  . Emesis During Pregnancy   Patient is a 23 y.o. female presenting with vomiting. The history is provided by the patient. No language interpreter was used.  Emesis Severity:  Moderate Duration:  2 weeks Timing:  Intermittent Progression:  Unchanged Relieved by:  Nothing Worsened by:  Nothing tried Ineffective treatments:  Antiemetics  HPI Comments: Kathryn Warren is a 23 y.o. female who is [redacted] weeks pregnant, who presents to the Emergency Department complaining of multiple episodes of emesis during pregnancy for the past 2 weeks.  She states that she is not able to keep down food or liquids. Patient was given medication the last time she was in the ER but does not remember the name of the medication.  Past Medical History  Diagnosis Date  . Seasonal allergies   . Pregnant    Past Surgical History  Procedure Laterality Date  . Dilation and curettage of uterus     No family history on file. History  Substance Use Topics  . Smoking status: Former Smoker -- 1.00 packs/day  . Smokeless tobacco: Not on file  . Alcohol Use: Yes     Comment: rarely   OB History   Grav Para Term Preterm Abortions TAB SAB Ect Mult Living   1              Review of Systems  Gastrointestinal: Positive for vomiting.  All other systems reviewed and are negative.  A complete 10 system review of systems was obtained and all systems are negative except as noted in the HPI and PMH.   Allergies  Review of patient's allergies indicates no known allergies.  Home Medications   Prior to Admission medications   Medication Sig Start Date End Date Taking? Authorizing Provider  nitrofurantoin, macrocrystal-monohydrate, (MACROBID) 100 MG  capsule Take 1 capsule (100 mg total) by mouth 2 (two) times daily. X 7 days 05/22/14  Yes John L Molpus, MD  Prenatal Vit-Fe Fumarate-FA (MULTIVITAMIN-PRENATAL) 27-0.8 MG TABS tablet Take 1 tablet by mouth daily at 12 noon.    Historical Provider, MD   Triage Vitals: BP 124/77  Pulse 95  Temp(Src) 98.5 F (36.9 C) (Oral)  Resp 18  SpO2 100%  LMP 03/25/2014  Physical Exam  Nursing note and vitals reviewed. Constitutional: She is oriented to person, place, and time. She appears well-developed and well-nourished. No distress.  HENT:  Head: Normocephalic and atraumatic.  Eyes: Conjunctivae and EOM are normal.  Neck: Neck supple. No tracheal deviation present.  Cardiovascular: Normal rate and regular rhythm.   Pulmonary/Chest: Effort normal and breath sounds normal. No respiratory distress.  Abdominal: Soft. She exhibits no distension.  Musculoskeletal: Normal range of motion.  Neurological: She is alert and oriented to person, place, and time.  Skin: Skin is warm and dry.  Psychiatric: She has a normal mood and affect. Her behavior is normal.   ED Course  Procedures (including critical care time)  DIAGNOSTIC STUDIES: Oxygen Saturation is 100% on RA, normal by my interpretation.    COORDINATION OF CARE: 4:15 PM- Discussed plans to order diagnostic lab work and will give patient phenergan. Pt advised of plan for treatment and pt agrees.  Labs Review Labs Reviewed -  No data to display  Imaging Review No results found.   EKG Interpretation None     MDM   Final diagnoses:  None    Patient is a 23 year old female who is [redacted] weeks pregnant. She's been having issues with vomiting and nausea throughout this pregnancy. She has been to the ER for IV fluids and anti-medics on multiple occasions. She returns today with similar complaints. She denies significant abdominal pain in her abdominal exam is benign. She was given IV fluid and anti-medics and is feeling somewhat better. At  this point I feel as though she is appropriate for discharge, to follow-up with her OB to discuss her issues.  I personally performed the services described in this documentation, which was scribed in my presence. The recorded information has been reviewed and is accurate.  Geoffery Lyonsouglas Tandy Grawe, MD 05/30/14 2256

## 2014-06-07 ENCOUNTER — Encounter (HOSPITAL_BASED_OUTPATIENT_CLINIC_OR_DEPARTMENT_OTHER): Payer: Self-pay | Admitting: Emergency Medicine

## 2014-07-14 ENCOUNTER — Encounter (HOSPITAL_BASED_OUTPATIENT_CLINIC_OR_DEPARTMENT_OTHER): Payer: Self-pay

## 2014-07-14 ENCOUNTER — Emergency Department (HOSPITAL_BASED_OUTPATIENT_CLINIC_OR_DEPARTMENT_OTHER)
Admission: EM | Admit: 2014-07-14 | Discharge: 2014-07-14 | Disposition: A | Discharge status: Home / Self Care | Payer: Medicaid Other | Attending: Emergency Medicine | Admitting: Emergency Medicine

## 2014-07-14 DIAGNOSIS — Z3202 Encounter for pregnancy test, result negative: Secondary | ICD-10-CM | POA: Insufficient documentation

## 2014-07-14 DIAGNOSIS — N939 Abnormal uterine and vaginal bleeding, unspecified: Secondary | ICD-10-CM | POA: Diagnosis not present

## 2014-07-14 DIAGNOSIS — R102 Pelvic and perineal pain: Secondary | ICD-10-CM | POA: Diagnosis present

## 2014-07-14 DIAGNOSIS — Z87891 Personal history of nicotine dependence: Secondary | ICD-10-CM | POA: Insufficient documentation

## 2014-07-14 DIAGNOSIS — N760 Acute vaginitis: Secondary | ICD-10-CM | POA: Diagnosis not present

## 2014-07-14 DIAGNOSIS — Z8709 Personal history of other diseases of the respiratory system: Secondary | ICD-10-CM | POA: Diagnosis not present

## 2014-07-14 DIAGNOSIS — R350 Frequency of micturition: Secondary | ICD-10-CM | POA: Insufficient documentation

## 2014-07-14 DIAGNOSIS — B9689 Other specified bacterial agents as the cause of diseases classified elsewhere: Secondary | ICD-10-CM

## 2014-07-14 DIAGNOSIS — Z791 Long term (current) use of non-steroidal anti-inflammatories (NSAID): Secondary | ICD-10-CM | POA: Diagnosis not present

## 2014-07-14 DIAGNOSIS — Z792 Long term (current) use of antibiotics: Secondary | ICD-10-CM | POA: Diagnosis not present

## 2014-07-14 LAB — WET PREP, GENITAL
Trich, Wet Prep: NONE SEEN
Yeast Wet Prep HPF POC: NONE SEEN

## 2014-07-14 LAB — HIV ANTIBODY (ROUTINE TESTING W REFLEX): HIV 1&2 Ab, 4th Generation: NONREACTIVE

## 2014-07-14 LAB — URINALYSIS, ROUTINE W REFLEX MICROSCOPIC
BILIRUBIN URINE: NEGATIVE
Glucose, UA: NEGATIVE mg/dL
KETONES UR: NEGATIVE mg/dL
NITRITE: NEGATIVE
Protein, ur: NEGATIVE mg/dL
Specific Gravity, Urine: 1.019 (ref 1.005–1.030)
UROBILINOGEN UA: 0.2 mg/dL (ref 0.0–1.0)
pH: 5.5 (ref 5.0–8.0)

## 2014-07-14 LAB — URINE MICROSCOPIC-ADD ON

## 2014-07-14 LAB — PREGNANCY, URINE: Preg Test, Ur: NEGATIVE

## 2014-07-14 MED ORDER — CEFTRIAXONE SODIUM 250 MG IJ SOLR
250.0000 mg | Freq: Once | INTRAMUSCULAR | Status: AC
  Administered 2014-07-14: 250 mg via INTRAMUSCULAR
  Filled 2014-07-14: qty 250

## 2014-07-14 MED ORDER — NAPROXEN 250 MG PO TABS
500.0000 mg | ORAL_TABLET | Freq: Once | ORAL | Status: AC
  Administered 2014-07-14: 500 mg via ORAL
  Filled 2014-07-14: qty 2

## 2014-07-14 MED ORDER — AZITHROMYCIN 250 MG PO TABS
1000.0000 mg | ORAL_TABLET | Freq: Once | ORAL | Status: AC
  Administered 2014-07-14: 1000 mg via ORAL
  Filled 2014-07-14: qty 4

## 2014-07-14 MED ORDER — NAPROXEN 500 MG PO TABS: 500.0000 mg | ORAL_TABLET | Freq: Two times a day (BID) | ORAL | Status: DC

## 2014-07-14 MED ORDER — LIDOCAINE HCL (PF) 1 % IJ SOLN
INTRAMUSCULAR | Status: AC
  Administered 2014-07-14: 5 mL
  Filled 2014-07-14: qty 5

## 2014-07-14 MED ORDER — METRONIDAZOLE 500 MG PO TABS: 500.0000 mg | ORAL_TABLET | Freq: Two times a day (BID) | ORAL | Status: DC

## 2014-07-14 NOTE — ED Notes (Signed)
Pt reports elective abortion Nov 5-c/o vaginal spotting and pain started last week

## 2014-07-14 NOTE — Discharge Instructions (Signed)
Please follow up with your primary care physician in 1-2 days. If you do not have one please call the [facility] number listed above. Please follow up with your Ob/Gyn to schedule a follow up appointment. Please take medications as prescribed. Please take your antibiotic until completion.  Please read all discharge instructions and return precautions.   Bacterial Vaginosis Bacterial vaginosis is a vaginal infection that occurs when the normal balance of bacteria in the vagina is disrupted. It results from an overgrowth of certain bacteria. This is the most common vaginal infection in women of childbearing age. Treatment is important to prevent complications, especially in pregnant women, as it can cause a premature delivery. CAUSES  Bacterial vaginosis is caused by an increase in harmful bacteria that are normally present in smaller amounts in the vagina. Several different kinds of bacteria can cause bacterial vaginosis. However, the reason that the condition develops is not fully understood. RISK FACTORS Certain activities or behaviors can put you at an increased risk of developing bacterial vaginosis, including:  Having a new sex partner or multiple sex partners.  Douching.  Using an intrauterine device (IUD) for contraception. Women do not get bacterial vaginosis from toilet seats, bedding, swimming pools, or contact with objects around them. SIGNS AND SYMPTOMS  Some women with bacterial vaginosis have no signs or symptoms. Common symptoms include:  Grey vaginal discharge.  A fishlike odor with discharge, especially after sexual intercourse.  Itching or burning of the vagina and vulva.  Burning or pain with urination. DIAGNOSIS  Your health care provider will take a medical history and examine the vagina for signs of bacterial vaginosis. A sample of vaginal fluid may be taken. Your health care provider will look at this sample under a microscope to check for  bacteria and abnormal cells. A vaginal pH test may also be done.  TREATMENT  Bacterial vaginosis may be treated with antibiotic medicines. These may be given in the form of a pill or a vaginal cream. A second round of antibiotics may be prescribed if the condition comes back after treatment.  HOME CARE INSTRUCTIONS   Only take over-the-counter or prescription medicines as directed by your health care provider.  If antibiotic medicine was prescribed, take it as directed. Make sure you finish it even if you start to feel better.  Do not have sex until treatment is completed.  Tell all sexual partners that you have a vaginal infection. They should see their health care provider and be treated if they have problems, such as a mild rash or itching.  Practice safe sex by using condoms and only having one sex partner. SEEK MEDICAL CARE IF:   Your symptoms are not improving after 3 days of treatment.  You have increased discharge or pain.  You have a fever. MAKE SURE YOU:   Understand these instructions.  Will watch your condition.  Will get help right away if you are not doing well or get worse. FOR MORE INFORMATION  Centers for Disease Control and Prevention, Division of STD Prevention: [employer].za American Sexual Health Association (ASHA): www.ashastd.org  Document Released: 07/23/2005 Document Revised: 05/13/2013 Document Reviewed: 03/04/2013 [facility] Patient Information 2015 [place], [place]. This information is not intended to replace advice given to you by your health care provider. Make sure you discuss any questions you have with your health care provider. Pelvic Pain Female pelvic pain can be caused by many different things and start from a variety of places. Pelvic pain refers to  pain that is located in the lower half of the abdomen and between your hips. The pain may occur over a short period of time (acute) or may be reoccurring (chronic). The cause of pelvic pain may be  related to disorders affecting the female reproductive organs (gynecologic), but it may also be related to the bladder, kidney stones, an intestinal complication, or muscle or skeletal problems. Getting help right away for pelvic pain is important, especially if there has been severe, sharp, or a sudden onset of unusual pain. It is also important to get help right away because some types of pelvic pain can be life threatening.  CAUSES  Below are only some of the causes of pelvic pain. The causes of pelvic pain can be in one of several categories.   Gynecologic.  Pelvic inflammatory disease.  Sexually transmitted infection.  Ovarian cyst or a twisted ovarian ligament (ovarian torsion).  Uterine lining that grows outside the uterus (endometriosis).  Fibroids, cysts, or tumors.  Ovulation.  Pregnancy.  Pregnancy that occurs outside the uterus (ectopic pregnancy).  Miscarriage.  Labor.  Abruption of the placenta or ruptured uterus.  Infection.  Uterine infection (endometritis).  Bladder infection.  Diverticulitis.  Miscarriage related to a uterine infection (septic abortion).  Bladder.  Inflammation of the bladder (cystitis).  Kidney stone(s).  Gastrointestinal.  Constipation.  Diverticulitis.  Neurologic.  Trauma.  Feeling pelvic pain because of mental or emotional causes (psychosomatic).  Cancers of the bowel or pelvis. EVALUATION  Your caregiver will want to take a careful history of your concerns. This includes recent changes in your health, a careful gynecologic history of your periods (menses), and a sexual history. Obtaining your family history and medical history is also important. Your caregiver may suggest a pelvic exam. A pelvic exam will help identify the location and severity of the pain. It also helps in the evaluation of which organ system may be involved. In order to identify the cause of the pelvic pain and be properly treated, your caregiver may  order tests. These tests may include:   A pregnancy test.  Pelvic ultrasonography.  An X-ray exam of the abdomen.  A urinalysis or evaluation of vaginal discharge.  Blood tests. HOME CARE INSTRUCTIONS   Only take over-the-counter or prescription medicines for pain, discomfort, or fever as directed by your caregiver.   Rest as directed by your caregiver.   Eat a balanced diet.   Drink enough fluids to make your urine clear or pale yellow, or as directed.   Avoid sexual intercourse if it causes pain.   Apply warm or cold compresses to the lower abdomen depending on which one helps the pain.   Avoid stressful situations.   Keep a journal of your pelvic pain. Write down when it started, where the pain is located, and if there are things that seem to be associated with the pain, such as food or your menstrual cycle.  Follow up with your caregiver as directed.  SEEK MEDICAL CARE IF:  Your medicine does not help your pain.  You have abnormal vaginal discharge. SEEK IMMEDIATE MEDICAL CARE IF:   You have heavy bleeding from the vagina.   Your pelvic pain increases.   You feel light-headed or faint.   You have chills.   You have pain with urination or blood in your urine.   You have uncontrolled diarrhea or vomiting.   You have a fever or persistent symptoms for more than 3 days.  You have a fever and  your symptoms suddenly get worse.   You are being physically or sexually abused.  MAKE SURE YOU:  Understand these instructions.  Will watch your condition.  Will get help if you are not doing well or get worse. Document Released: 06/19/2004 Document Revised: 12/07/2013 Document Reviewed: 11/12/2011 [facility] Patient Information 2015 [place], [place]. This information is not intended to replace advice given to you by your health care provider. Make sure you discuss any questions you have with your health care provider.

## 2014-07-14 NOTE — ED Provider Notes (Signed)
CSN: [id number]     Arrival date & time 07/14/14  1212 History   First MD Initiated Contact with Patient 07/14/14 1217     Chief Complaint  Patient presents with  . Pelvic Pain     (Consider location/radiation/quality/duration/timing/severity/associated sxs/prior Treatment) HPI Comments: Patient is a [id number] 23 yo F presenting to the ED for one week of intermittent episodes of pelvic cramping with light spotting. Patient states this is the first bleeding she has had since having a D&C on November 5th. She states she has had recent unprotected sexual intercourse and is concerned about sexually transmitted diseases. She has tried Ibuprofen for her cramping with no relief of her symptoms. No modifying factors identified. No abdominal surgical history.   Patient is a 23 y.o. female presenting with pelvic pain.  Pelvic Pain Pertinent negatives include no abdominal pain, chills, fever, nausea or vomiting.    Past Medical History  Diagnosis Date  . Seasonal allergies    Past Surgical History  Procedure Laterality Date  . Dilation and curettage of uterus     No family history on file. History  Substance Use Topics  . Smoking status: Former Smoker -- 1.00 packs/day  . Smokeless tobacco: Not on file  . Alcohol Use: Yes     Comment: rarely   OB History    Gravida Para Term Preterm AB TAB SAB Ectopic Multiple Living   1              Review of Systems  Constitutional: Negative for fever and chills.  Gastrointestinal: Negative for nausea, vomiting and abdominal pain.  Genitourinary: Positive for frequency, vaginal bleeding and pelvic pain. Negative for hematuria.  All other systems reviewed and are negative.     Allergies  Review of patient's allergies indicates no known allergies.  Home Medications   Prior to Admission medications   Medication Sig Start Date End Date Taking? Authorizing Provider  metroNIDAZOLE (FLAGYL) 500 MG tablet Take 1 tablet (500 mg total) by mouth 2  (two) times daily. 07/14/14   Bonniejean Piano L Nethaniel Mattie, PA-C  naproxen (NAPROSYN) 500 MG tablet Take 1 tablet (500 mg total) by mouth 2 (two) times daily with a meal. 07/14/14   Woodrow Dulski L Timmya Blazier, PA-C  Prenatal Vit-Fe Fumarate-FA (MULTIVITAMIN-PRENATAL) 27-0.8 MG TABS tablet Take 1 tablet by mouth daily at 12 noon.    Historical Provider, MD   BP 120/78 mmHg  Pulse 90  Temp(Src) 99 F (37.2 C) (Oral)  Resp 16  Ht 6\' 1"  (1.854 m)  Wt 186 lb (84.369 kg)  BMI 24.55 kg/m2  SpO2 98%  LMP   Breastfeeding? Unknown Physical Exam  Constitutional: She is oriented to person, place, and time. She appears well-developed and well-nourished. No distress.  HENT:  Head: Normocephalic and atraumatic.  Right Ear: External ear normal.  Left Ear: External ear normal.  Nose: Nose normal.  Mouth/Throat: Oropharynx is clear and moist.  Eyes: Conjunctivae are normal.  Neck: Normal range of motion. Neck supple.  Cardiovascular: Normal rate, regular rhythm and normal heart sounds.   Pulmonary/Chest: Effort normal and breath sounds normal.  Abdominal: Soft. Bowel sounds are normal. She exhibits no distension. There is no tenderness. There is no rebound and no guarding.  Musculoskeletal: Normal range of motion.  Neurological: She is alert and oriented to person, place, and time.  Skin: Skin is warm and dry. She is not diaphoretic.  Psychiatric: She has a normal mood and affect.  Nursing note and vitals reviewed.  Exam performed  by Jeannetta EllisPIEPENBRINK, Nylee Barbuto L,  exam chaperoned Date: 07/14/2014 Pelvic exam: normal external genitalia without evidence of trauma. VULVA: normal appearing vulva with no masses, tenderness or lesion. VAGINA: normal appearing vagina with normal color and discharge, no lesions. CERVIX: normal appearing cervix without lesions, cervical motion tenderness absent, cervical os closed with out purulent discharge; vaginal discharge - bloody, Wet prep and DNA probe for chlamydia and GC  obtained.   ADNEXA: normal adnexa in size, nontender and no masses UTERUS: uterus is normal size, shape, consistency and nontender.   ED Course  Procedures (including critical care time) Medications  naproxen (NAPROSYN) tablet 500 mg (500 mg Oral Given 07/14/14 1359)  cefTRIAXone (ROCEPHIN) injection 250 mg (250 mg Intramuscular Given 07/14/14 1424)  azithromycin (ZITHROMAX) tablet 1,000 mg (1,000 mg Oral Given 07/14/14 1424)  lidocaine (PF) (XYLOCAINE) 1 % injection (5 mLs  Given 07/14/14 1424)    Labs Review Labs Reviewed  WET PREP, GENITAL - Abnormal; Notable for the following:    Clue Cells Wet Prep HPF POC FEW (*)    WBC, Wet Prep HPF POC FEW (*)    All other components within normal limits  URINALYSIS, ROUTINE W REFLEX MICROSCOPIC - Abnormal; Notable for the following:    APPearance CLOUDY (*)    Hgb urine dipstick LARGE (*)    Leukocytes, UA SMALL (*)    All other components within normal limits  URINE MICROSCOPIC-ADD ON - Abnormal; Notable for the following:    Squamous Epithelial / LPF MANY (*)    Bacteria, UA MANY (*)    Casts HYALINE CASTS (*)    All other components within normal limits  GC/CHLAMYDIA PROBE AMP  PREGNANCY, URINE  HIV ANTIBODY (ROUTINE TESTING)    Imaging Review No results found.   EKG Interpretation None      MDM   Final diagnoses:  Vaginal spotting  Bacterial vaginosis    Filed Vitals:   07/14/14 1450  BP: 120/78  Pulse: 90  Temp:   Resp: 16   Afebrile, NAD, non-toxic appearing, AAOx4.  Patient to be discharged with instructions to follow up with OBGYN. Vaginal spotting likely first menstruation s/p D&C 5 weeks ago. Pt understands GC/Chlamydia cultures pending and that they will need to inform all sexual partners within the last 6 months if results return positive. Pt has been treated prophylacticly with azithromycin and rocephin due to pts history, pelvic exam, and wet prep with increased WBCs. Pt advised that she will receive a call  in 48 hours if the test is positive and to refrain from sexual activity for 48 hours. If the test is positive, pt is advised to refrain from sexual activity for 10 days for the medicine to take effect.  Pt not concerning for PID because hemodynamically stable and no cervical motion tenderness on pelvic exam. Pt has also been treated with flagyl for Bacterial Vaginosis. Pt has been advised to not drink alcohol while on this medication.  iscussed that because pt has had recent unprotected sex, might want to consider getting tested for HIV as well. Counseled pt that latex condoms are the only way to prevent against STDs or HIV. Patient is stable at time of discharge Patient d/w with Dr. Hyacinth MeekerMiller who agrees with plan.           Jeannetta EllisJennifer L Sham Alviar, PA-C 07/14/14 1632  Vida RollerBrian D Miller, MD 07/15/14 [phone]

## 2014-07-15 LAB — GC/CHLAMYDIA PROBE AMP
CT PROBE, AMP APTIMA: NEGATIVE
GC PROBE AMP APTIMA: NEGATIVE

## 2014-12-21 ENCOUNTER — Emergency Department (HOSPITAL_BASED_OUTPATIENT_CLINIC_OR_DEPARTMENT_OTHER)
Admission: EM | Admit: 2014-12-21 | Discharge: 2014-12-21 | Disposition: A | Discharge status: Home / Self Care | Payer: Medicaid Other | Attending: Emergency Medicine | Admitting: Emergency Medicine

## 2014-12-21 ENCOUNTER — Emergency Department (HOSPITAL_BASED_OUTPATIENT_CLINIC_OR_DEPARTMENT_OTHER): Payer: Medicaid Other

## 2014-12-21 ENCOUNTER — Encounter (HOSPITAL_BASED_OUTPATIENT_CLINIC_OR_DEPARTMENT_OTHER): Payer: Self-pay | Admitting: *Deleted

## 2014-12-21 DIAGNOSIS — S3992XA Unspecified injury of lower back, initial encounter: Secondary | ICD-10-CM | POA: Diagnosis present

## 2014-12-21 DIAGNOSIS — R1030 Lower abdominal pain, unspecified: Secondary | ICD-10-CM

## 2014-12-21 DIAGNOSIS — Y998 Other external cause status: Secondary | ICD-10-CM | POA: Insufficient documentation

## 2014-12-21 DIAGNOSIS — Z79899 Other long term (current) drug therapy: Secondary | ICD-10-CM | POA: Insufficient documentation

## 2014-12-21 DIAGNOSIS — Y92481 Parking lot as the place of occurrence of the external cause: Secondary | ICD-10-CM | POA: Insufficient documentation

## 2014-12-21 DIAGNOSIS — Z3202 Encounter for pregnancy test, result negative: Secondary | ICD-10-CM | POA: Insufficient documentation

## 2014-12-21 DIAGNOSIS — S3991XA Unspecified injury of abdomen, initial encounter: Secondary | ICD-10-CM | POA: Insufficient documentation

## 2014-12-21 DIAGNOSIS — Z720 Tobacco use: Secondary | ICD-10-CM | POA: Insufficient documentation

## 2014-12-21 DIAGNOSIS — S39012A Strain of muscle, fascia and tendon of lower back, initial encounter: Secondary | ICD-10-CM | POA: Insufficient documentation

## 2014-12-21 DIAGNOSIS — Y9389 Activity, other specified: Secondary | ICD-10-CM | POA: Diagnosis not present

## 2014-12-21 LAB — URINE MICROSCOPIC-ADD ON

## 2014-12-21 LAB — CBC WITH DIFFERENTIAL/PLATELET
BASOS ABS: 0 10*3/uL (ref 0.0–0.1)
BASOS PCT: 0 % (ref 0–1)
Eosinophils Absolute: 0.3 10*3/uL (ref 0.0–0.7)
Eosinophils Relative: 5 % (ref 0–5)
HCT: 35.7 % — ABNORMAL LOW (ref 36.0–46.0)
HEMOGLOBIN: 11.7 g/dL — AB (ref 12.0–15.0)
LYMPHS PCT: 49 % — AB (ref 12–46)
Lymphs Abs: 2.7 10*3/uL (ref 0.7–4.0)
MCH: 27.9 pg (ref 26.0–34.0)
MCHC: 32.8 g/dL (ref 30.0–36.0)
MCV: 85.2 fL (ref 78.0–100.0)
MONO ABS: 0.5 10*3/uL (ref 0.1–1.0)
MONOS PCT: 10 % (ref 3–12)
NEUTROS ABS: 2 10*3/uL (ref 1.7–7.7)
NEUTROS PCT: 36 % — AB (ref 43–77)
Platelets: 306 10*3/uL (ref 150–400)
RBC: 4.19 MIL/uL (ref 3.87–5.11)
RDW: 15.2 % (ref 11.5–15.5)
WBC: 5.6 10*3/uL (ref 4.0–10.5)

## 2014-12-21 LAB — BASIC METABOLIC PANEL
Anion gap: 7 (ref 5–15)
BUN: 13 mg/dL (ref 6–20)
CALCIUM: 9.2 mg/dL (ref 8.9–10.3)
CHLORIDE: 103 mmol/L (ref 101–111)
CO2: 28 mmol/L (ref 22–32)
CREATININE: 0.91 mg/dL (ref 0.44–1.00)
GFR calc Af Amer: >60 mL/min (ref >60)
GFR calc non Af Amer: >60 mL/min (ref >60)
Glucose, Bld: 103 mg/dL — ABNORMAL HIGH (ref 65–99)
Potassium: 4.1 mmol/L (ref 3.5–5.1)
Sodium: 138 mmol/L (ref 135–145)

## 2014-12-21 LAB — URINALYSIS, ROUTINE W REFLEX MICROSCOPIC
Bilirubin Urine: NEGATIVE
GLUCOSE, UA: NEGATIVE mg/dL
Ketones, ur: NEGATIVE mg/dL
Nitrite: NEGATIVE
PH: 6.5 (ref 5.0–8.0)
Protein, ur: NEGATIVE mg/dL
Specific Gravity, Urine: 1.022 (ref 1.005–1.030)
Urobilinogen, UA: 1 mg/dL (ref 0.0–1.0)

## 2014-12-21 LAB — PREGNANCY, URINE: PREG TEST UR: NEGATIVE

## 2014-12-21 MED ORDER — IOHEXOL 300 MG/ML  SOLN
100.0000 mL | Freq: Once | INTRAMUSCULAR | Status: AC | PRN
  Administered 2014-12-21: 100 mL via INTRAVENOUS

## 2014-12-21 MED ORDER — ACETAMINOPHEN 500 MG PO TABS
1000.0000 mg | ORAL_TABLET | Freq: Once | ORAL | Status: AC
  Administered 2014-12-21: 1000 mg via ORAL
  Filled 2014-12-21: qty 2

## 2014-12-21 MED ORDER — SODIUM CHLORIDE 0.9 % IV BOLUS (SEPSIS)
1000.0000 mL | Freq: Once | INTRAVENOUS | Status: AC
  Administered 2014-12-21: 1000 mL via INTRAVENOUS
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
## 2015-03-07 ENCOUNTER — Emergency Department (HOSPITAL_BASED_OUTPATIENT_CLINIC_OR_DEPARTMENT_OTHER)
Admission: EM | Admit: 2015-03-07 | Discharge: 2015-03-07 | Disposition: A | Discharge status: Home / Self Care | Payer: Medicaid Other | Attending: Emergency Medicine | Admitting: Emergency Medicine

## 2015-03-07 ENCOUNTER — Encounter (HOSPITAL_BASED_OUTPATIENT_CLINIC_OR_DEPARTMENT_OTHER): Payer: Self-pay | Admitting: *Deleted

## 2015-03-07 DIAGNOSIS — M545 Low back pain, unspecified: Secondary | ICD-10-CM

## 2015-03-07 DIAGNOSIS — Y998 Other external cause status: Secondary | ICD-10-CM | POA: Diagnosis not present

## 2015-03-07 DIAGNOSIS — Z79899 Other long term (current) drug therapy: Secondary | ICD-10-CM | POA: Diagnosis not present

## 2015-03-07 DIAGNOSIS — Z720 Tobacco use: Secondary | ICD-10-CM | POA: Insufficient documentation

## 2015-03-07 DIAGNOSIS — Y9389 Activity, other specified: Secondary | ICD-10-CM | POA: Diagnosis not present

## 2015-03-07 DIAGNOSIS — Y92410 Unspecified street and highway as the place of occurrence of the external cause: Secondary | ICD-10-CM | POA: Insufficient documentation

## 2015-03-07 DIAGNOSIS — S3992XA Unspecified injury of lower back, initial encounter: Secondary | ICD-10-CM | POA: Diagnosis not present

## 2015-03-07 DIAGNOSIS — S0990XA Unspecified injury of head, initial encounter: Secondary | ICD-10-CM | POA: Insufficient documentation

## 2015-03-07 MED ORDER — NAPROXEN 500 MG PO TABS: 500.0000 mg | ORAL_TABLET | Freq: Two times a day (BID) | ORAL | Status: DC

## 2015-03-07 MED ORDER — CYCLOBENZAPRINE HCL 5 MG PO TABS: 5.0000 mg | ORAL_TABLET | Freq: Three times a day (TID) | ORAL | Status: DC | PRN
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
## 2015-03-07 NOTE — Discharge Instructions (Signed)
Return to the ED with any concerns including weakness of legs, not able to urinate, loss of control of bowel or bladder, chest pain, abdominal pain, decreased level of alertness/lethargy, or any other alarming symptoms

## 2015-03-18 ENCOUNTER — Encounter (HOSPITAL_BASED_OUTPATIENT_CLINIC_OR_DEPARTMENT_OTHER): Payer: Self-pay

## 2015-03-18 ENCOUNTER — Emergency Department (HOSPITAL_BASED_OUTPATIENT_CLINIC_OR_DEPARTMENT_OTHER)
Admission: EM | Admit: 2015-03-18 | Discharge: 2015-03-18 | Disposition: A | Discharge status: Home / Self Care | Payer: Medicaid Other | Attending: Emergency Medicine | Admitting: Emergency Medicine

## 2015-03-18 DIAGNOSIS — Z720 Tobacco use: Secondary | ICD-10-CM | POA: Diagnosis not present

## 2015-03-18 DIAGNOSIS — Z3202 Encounter for pregnancy test, result negative: Secondary | ICD-10-CM | POA: Diagnosis not present

## 2015-03-18 DIAGNOSIS — R51 Headache: Secondary | ICD-10-CM | POA: Insufficient documentation

## 2015-03-18 DIAGNOSIS — R102 Pelvic and perineal pain: Secondary | ICD-10-CM | POA: Diagnosis present

## 2015-03-18 DIAGNOSIS — N3090 Cystitis, unspecified without hematuria: Secondary | ICD-10-CM | POA: Diagnosis not present

## 2015-03-18 LAB — URINALYSIS, ROUTINE W REFLEX MICROSCOPIC
BILIRUBIN URINE: NEGATIVE
GLUCOSE, UA: NEGATIVE mg/dL
KETONES UR: NEGATIVE mg/dL
Nitrite: NEGATIVE
Protein, ur: NEGATIVE mg/dL
Specific Gravity, Urine: 1.018 (ref 1.005–1.030)
Urobilinogen, UA: 1 mg/dL (ref 0.0–1.0)
pH: 7.5 (ref 5.0–8.0)

## 2015-03-18 LAB — URINE MICROSCOPIC-ADD ON

## 2015-03-18 LAB — PREGNANCY, URINE: Preg Test, Ur: NEGATIVE

## 2015-03-18 MED ORDER — PHENAZOPYRIDINE HCL 200 MG PO TABS: 200.0000 mg | ORAL_TABLET | Freq: Three times a day (TID) | ORAL | Status: DC | PRN

## 2015-03-18 MED ORDER — NITROFURANTOIN MONOHYD MACRO 100 MG PO CAPS: 100.0000 mg | ORAL_CAPSULE | Freq: Two times a day (BID) | ORAL | Status: DC

## 2015-03-18 MED ORDER — NITROFURANTOIN MONOHYD MACRO 100 MG PO CAPS
100.0000 mg | ORAL_CAPSULE | Freq: Once | ORAL | Status: AC
  Administered 2015-03-18: 100 mg via ORAL
  Filled 2015-03-18: qty 1

## 2015-03-18 NOTE — ED Provider Notes (Signed)
CSN: [id number]     Arrival date & time 03/18/15  1918 History  This chart was scribed for Doug Sou, MD by Placido Sou, ED scribe. This patient was seen in room MH01/MH01 and the patient's care was started at 9:43 PM.    Chief Complaint  Patient presents with  . Vaginal Pain   HPI  HPI Comments: Kathryn Warren is a 24 y.o. female,  who presents to the Emergency Department complaining of constant, mild, centralized abd pain with onset 5 days ago. She notes associated, intermittent, HA's every 2 hours to the top of her head with onset 6 days ago and mdysuria which began 1 week ago. She complains of burning at urethral meatus at the end of urination. Denies vaginal discharge no fever. No nausea or vomiting. Pt notes taking alka seltzer for her HA which provides short term relief. Her LNMP was late July and her last BM was earlier today with no abnormalities. She denies any known drug allergies or any other medical issues. Pt denies regular ETOH consumption or illegal drug use. Pt denies any vaginal discharge. Denies headache or abdominal pain presently.  Past Medical History  Diagnosis Date  . Seasonal allergies    Past Surgical History  Procedure Laterality Date  . Dilation and curettage of uterus     History reviewed. No pertinent family history. Social History  Substance Use Topics  . Smoking status: Current Every Day Smoker -- 1.00 packs/day    Types: Cigarettes  . Smokeless tobacco: None  . Alcohol Use: Yes     Comment: rarely   OB History    Gravida Para Term Preterm AB TAB SAB Ectopic Multiple Living   1              Review of Systems  Constitutional: Negative.   Respiratory: Negative.   Cardiovascular: Negative.   Gastrointestinal: Positive for abdominal pain.  Genitourinary: Positive for dysuria. Negative for vaginal pain.  Musculoskeletal: Negative.   Skin: Negative.   Neurological: Positive for headaches.  Psychiatric/Behavioral: Negative.   All other  systems reviewed and are negative.   Allergies  Review of patient's allergies indicates no known allergies.  Home Medications   Prior to Admission medications   Medication Sig Start Date End Date Taking? Authorizing Provider  cyclobenzaprine (FLEXERIL) 5 MG tablet Take 1 tablet (5 mg total) by mouth 3 (three) times daily as needed for muscle spasms. 03/07/15   Jerelyn Scott, MD  naproxen (NAPROSYN) 500 MG tablet Take 1 tablet (500 mg total) by mouth 2 (two) times daily. 03/07/15   Jerelyn Scott, MD  Prenatal Vit-Fe Fumarate-FA (MULTIVITAMIN-PRENATAL) 27-0.8 MG TABS tablet Take 1 tablet by mouth daily at 12 noon.    Historical Provider, MD   BP 142/85 mmHg  Pulse 84  Temp(Src) 98.6 F (37 C) (Oral)  Resp 16  Ht 6' (1.829 m)  Wt 180 lb (81.647 kg)  BMI 24.41 kg/m2  SpO2 99%  LMP 03/05/2015 Physical Exam  Constitutional: She appears well-developed and well-nourished.  HENT:  Head: Normocephalic and atraumatic.  Eyes: Conjunctivae are normal. Pupils are equal, round, and reactive to light.  Neck: Neck supple. No tracheal deviation present. No thyromegaly present.  Cardiovascular: Normal rate and regular rhythm.   No murmur heard. Pulmonary/Chest: Effort normal and breath sounds normal.  Abdominal: Soft. Bowel sounds are normal. She exhibits no distension. There is no tenderness.  Musculoskeletal: Normal range of motion. She exhibits no edema or tenderness.  Neurological: She is alert. No  cranial nerve deficit. Coordination normal.  Gait normal Romberg normal finger to nose normal  Skin: Skin is warm and dry. No rash noted.  Psychiatric: She has a normal mood and affect.  Nursing note and vitals reviewed.   ED Course  Procedures  DIAGNOSTIC STUDIES: Oxygen Saturation is 99% on RA, normal by my interpretation.    COORDINATION OF CARE: 9:47 PM Discussed treatment plan with pt at bedside and pt agreed to plan.  Labs Review Labs Reviewed  URINALYSIS, ROUTINE W REFLEX  MICROSCOPIC (NOT AT [facility]) - Abnormal; Notable for the following:    APPearance TURBID (*)    Hgb urine dipstick SMALL (*)    Leukocytes, UA LARGE (*)    All other components within normal limits  URINE MICROSCOPIC-ADD ON - Abnormal; Notable for the following:    Squamous Epithelial / LPF FEW (*)    Bacteria, UA FEW (*)    All other components within normal limits  PREGNANCY, URINE    Imaging Review No results found. I, Placido Sou, personally reviewed and evaluated these images and lab results as part of my medical decision-making.   EKG Interpretation None     Results for orders placed or performed during the hospital encounter of 03/18/15  Pregnancy, urine  Result Value Ref Range   Preg Test, Ur NEGATIVE NEGATIVE  Urinalysis, Routine w reflex microscopic (not at [facility])  Result Value Ref Range   Color, Urine YELLOW YELLOW   APPearance TURBID (A) CLEAR   Specific Gravity, Urine 1.018 1.005 - 1.030   pH 7.5 5.0 - 8.0   Glucose, UA NEGATIVE NEGATIVE mg/dL   Hgb urine dipstick SMALL (A) NEGATIVE   Bilirubin Urine NEGATIVE NEGATIVE   Ketones, ur NEGATIVE NEGATIVE mg/dL   Protein, ur NEGATIVE NEGATIVE mg/dL   Urobilinogen, UA 1.0 0.0 - 1.0 mg/dL   Nitrite NEGATIVE NEGATIVE   Leukocytes, UA LARGE (A) NEGATIVE  Urine microscopic-add on  Result Value Ref Range   Squamous Epithelial / LPF FEW (A) RARE   WBC, UA 21-50 <3 WBC/hpf   RBC / HPF 3-6 <3 RBC/hpf   Bacteria, UA FEW (A) RARE   Urine-Other AMORPHOUS URATES/PHOSPHATES    No results found.  MDM  Symptoms and urinalysis consistent with cystitis. Final diagnoses:  None   Plan prescription Macrobid, Pyridium. Follow-up health department as needed Diagnosis #1 cystitis #2 nonspecific headache   I personally performed the services described in this documentation, which was scribed in my presence. The recorded information has been reviewed and considered.    Doug Sou, MD 03/18/15 [id number]

## 2015-03-18 NOTE — ED Notes (Signed)
Pt reports nausea, headache, vaginal pain with discharge. Pt also states she wants to get checked to see if she has any "worms in her stomach."

## 2015-03-18 NOTE — Discharge Instructions (Signed)
Urinary Tract Infection See your physician at the health Department if not improved after completing the medication. Return if you are unable to hold on the medication without vomiting, develop fever or if you feel worse for any reason. A urinary tract infection (UTI) can occur any place along the urinary tract. The tract includes the kidneys, ureters, bladder, and urethra. A type of germ called bacteria often causes a UTI. UTIs are often helped with antibiotic medicine.  HOME CARE   If given, take antibiotics as told by your doctor. Finish them even if you start to feel better.  Drink enough fluids to keep your pee (urine) clear or pale yellow.  Avoid tea, drinks with caffeine, and bubbly (carbonated) drinks.  Pee often. Avoid holding your pee in for a long time.  Pee before and after having sex (intercourse).  Wipe from front to back after you poop (bowel movement) if you are a woman. Use each tissue only once. GET HELP RIGHT AWAY IF:   You have back pain.  You have lower belly (abdominal) pain.  You have chills.  You feel sick to your stomach (nauseous).  You throw up (vomit).  Your burning or discomfort with peeing does not go away.  You have a fever.  Your symptoms are not better in 3 days. MAKE SURE YOU:   Understand these instructions.  Will watch your condition.  Will get help right away if you are not doing well or get worse. Document Released: 01/09/2008 Document Revised: 04/16/2012 Document Reviewed: 02/21/2012 [facility] Patient Information 2015 [place], [place]. This information is not intended to replace advice given to you by your health care provider. Make sure you discuss any questions you have with your health care provider.

## 2015-03-18 NOTE — ED Notes (Signed)
Pt reports right sided HA, abdominal pain and dysuria x 5 days.  Denies vaginal discharge.  LBM today.  LMP: last of July.

## 2015-04-02 ENCOUNTER — Encounter (HOSPITAL_BASED_OUTPATIENT_CLINIC_OR_DEPARTMENT_OTHER): Payer: Self-pay | Admitting: Emergency Medicine

## 2015-04-02 ENCOUNTER — Emergency Department (HOSPITAL_BASED_OUTPATIENT_CLINIC_OR_DEPARTMENT_OTHER)
Admission: EM | Admit: 2015-04-02 | Discharge: 2015-04-02 | Disposition: A | Discharge status: Home / Self Care | Payer: Medicaid Other | Attending: Emergency Medicine | Admitting: Emergency Medicine

## 2015-04-02 DIAGNOSIS — R51 Headache: Secondary | ICD-10-CM | POA: Diagnosis present

## 2015-04-02 DIAGNOSIS — Z720 Tobacco use: Secondary | ICD-10-CM | POA: Insufficient documentation

## 2015-04-02 DIAGNOSIS — I10 Essential (primary) hypertension: Secondary | ICD-10-CM | POA: Insufficient documentation

## 2015-04-02 LAB — BASIC METABOLIC PANEL
Anion gap: 6 (ref 5–15)
BUN: 11 mg/dL (ref 6–20)
CO2: 27 mmol/L (ref 22–32)
CREATININE: 0.86 mg/dL (ref 0.44–1.00)
Calcium: 8.8 mg/dL — ABNORMAL LOW (ref 8.9–10.3)
Chloride: 104 mmol/L (ref 101–111)
GFR calc Af Amer: >60 mL/min (ref >60)
GLUCOSE: 109 mg/dL — AB (ref 65–99)
Potassium: 4 mmol/L (ref 3.5–5.1)
Sodium: 137 mmol/L (ref 135–145)

## 2015-04-02 MED ORDER — IBUPROFEN 400 MG PO TABS
600.0000 mg | ORAL_TABLET | Freq: Once | ORAL | Status: AC
  Administered 2015-04-02: 600 mg via ORAL
  Filled 2015-04-02 (×2): qty 1

## 2015-04-02 MED ORDER — HYDROCHLOROTHIAZIDE 25 MG PO TABS: 25.0000 mg | ORAL_TABLET | Freq: Every day | ORAL | Status: DC

## 2015-04-02 NOTE — ED Provider Notes (Signed)
CSN: [id number]     Arrival date & time 04/02/15  0941 History   First MD Initiated Contact with Patient 04/02/15 1017     Chief Complaint  Patient presents with  . Headache      HPI Patient presents with three-day history of on again off again headache.  Been taking Tylenol and ibuprofen with some relief in headache comes back.  No history of recent trauma.  Denies fever chills or neck pain or neck stiffness.  Has family history of hypertension but never been told she had hypertension or treated for hypertension in the past. Past Medical History  Diagnosis Date  . Seasonal allergies    Past Surgical History  Procedure Laterality Date  . Dilation and curettage of uterus     No family history on file. Social History  Substance Use Topics  . Smoking status: Current Every Day Smoker -- 1.00 packs/day    Types: Cigarettes  . Smokeless tobacco: None  . Alcohol Use: Yes     Comment: rarely   OB History    Gravida Para Term Preterm AB TAB SAB Ectopic Multiple Living   1              Review of Systems  All other systems reviewed and are negative  Allergies  Review of patient's allergies indicates no known allergies.  Home Medications   Prior to Admission medications   Medication Sig Start Date End Date Taking? Authorizing Provider  hydrochlorothiazide (HYDRODIURIL) 25 MG tablet Take 1 tablet (25 mg total) by mouth daily. 04/02/15   Nelva Nay, MD   BP 137/91 mmHg  Pulse 84  Temp(Src) 98.4 F (36.9 C) (Oral)  Resp 20  Ht 6' (1.829 m)  SpO2 98%  LMP 03/05/2015 Physical Exam Normal physical examPhysical Exam  Nursing note and vitals reviewed. Constitutional: She is oriented to person, place, and time. She appears well-developed and well-nourished. No distress.  HENT:  Head: Normocephalic and atraumatic.  Eyes: Pupils are equal, round, and reactive to light.  Neck: Normal range of motion.  Cardiovascular: Normal rate and intact distal pulses.   Pulmonary/Chest: No  respiratory distress.  Abdominal: Normal appearance. She exhibits no distension.  Musculoskeletal: Normal range of motion.  Neurological: She is alert and oriented to person, place, and time. No cranial nerve deficit.  Skin: Skin is warm and dry. No rash noted.  Psychiatric: She has a normal mood and affect. Her behavior is normal.   ED Course  Procedures (including critical care time) Labs Review Labs Reviewed  BASIC METABOLIC PANEL - Abnormal; Notable for the following:    Glucose, Bld 109 (*)    Calcium 8.8 (*)    All other components within normal limits    Imaging Review No results found. I have personally reviewed and evaluated these images and lab results as part of my medical decision-making.    MDM   Final diagnoses:  Essential hypertension        Nelva Nay, MD 04/02/15 [phone]

## 2015-04-02 NOTE — ED Notes (Signed)
Headache x 3 dAYS  WEAKNESS AND IRREG MENSES X 2 MONTHS

## 2015-04-02 NOTE — Discharge Instructions (Signed)
Hypertension Hypertension, commonly called high blood pressure, is when the force of blood pumping through your arteries is too strong. Your arteries are the blood vessels that carry blood from your heart throughout your body. A blood pressure reading consists of a higher number over a lower number, such as 110/72. The higher number (systolic) is the pressure inside your arteries when your heart pumps. The lower number (diastolic) is the pressure inside your arteries when your heart relaxes. Ideally you want your blood pressure below 120/80. Hypertension forces your heart to work harder to pump blood. Your arteries may become narrow or stiff. Having hypertension puts you at risk for heart disease, stroke, and other problems.  RISK FACTORS Some risk factors for high blood pressure are controllable. Others are not.  Risk factors you cannot control include:   Race. You may be at higher risk if you are African American.  Age. Risk increases with age.  Gender. Men are at higher risk than women before age 45 years. After age 65, women are at higher risk than men. Risk factors you can control include:  Not getting enough exercise or physical activity.  Being overweight.  Getting too much fat, sugar, calories, or salt in your diet.  Drinking too much alcohol. SIGNS AND SYMPTOMS Hypertension does not usually cause signs or symptoms. Extremely high blood pressure (hypertensive crisis) may cause headache, anxiety, shortness of breath, and nosebleed. DIAGNOSIS  To check if you have hypertension, your health care provider will measure your blood pressure while you are seated, with your arm held at the level of your heart. It should be measured at least twice using the same arm. Certain conditions can cause a difference in blood pressure between your right and left arms. A blood pressure reading that is higher than normal on one occasion does not mean that you need treatment. If one blood pressure reading  is high, ask your health care provider about having it checked again. TREATMENT  Treating high blood pressure includes making lifestyle changes and possibly taking medicine. Living a healthy lifestyle can help lower high blood pressure. You may need to change some of your habits. Lifestyle changes may include:  Following the DASH diet. This diet is high in fruits, vegetables, and whole grains. It is low in salt, red meat, and added sugars.  Getting at least 2 hours of brisk physical activity every week.  Losing weight if necessary.  Not smoking.  Limiting alcoholic beverages.  Learning ways to reduce stress. If lifestyle changes are not enough to get your blood pressure under control, your health care provider may prescribe medicine. You may need to take more than one. Work closely with your health care provider to understand the risks and benefits. HOME CARE INSTRUCTIONS  Have your blood pressure rechecked as directed by your health care provider.   Take medicines only as directed by your health care provider. Follow the directions carefully. Blood pressure medicines must be taken as prescribed. The medicine does not work as well when you skip doses. Skipping doses also puts you at risk for problems.   Do not smoke.   Monitor your blood pressure at home as directed by your health care provider. SEEK MEDICAL CARE IF:   You think you are having a reaction to medicines taken.  You have recurrent headaches or feel dizzy.  You have swelling in your ankles.  You have trouble with your vision. SEEK IMMEDIATE MEDICAL CARE IF:  You develop a severe headache or confusion.    You have unusual weakness, numbness, or feel faint.  You have severe chest or abdominal pain.  You vomit repeatedly.  You have trouble breathing. MAKE SURE YOU:   Understand these instructions.  Will watch your condition.  Will get help right away if you are not doing well or get worse. Document  Released: 07/23/2005 Document Revised: 12/07/2013 Document Reviewed: 05/15/2013 Exit[employer] Patient Information 2015 [employer]. This information is not intended to replace advice given to you by your health care provider. Make sure you discuss any questions you have with your health care provider. DASH Eating Plan DASH stands for "Dietary Approaches to Stop Hypertension." The DASH eating plan is a healthy eating plan that has been shown to reduce high blood pressure (hypertension). Additional health benefits may include reducing the risk of type 2 diabetes mellitus, heart disease, and stroke. The DASH eating plan may also help with weight loss. WHAT DO I NEED TO KNOW ABOUT THE DASH EATING PLAN? For the DASH eating plan, you will follow these general guidelines:  Choose foods with a percent daily value for sodium of less than 5% (as listed on the food label).  Use salt-free seasonings or herbs instead of table salt or sea salt.  Check with your health care provider or pharmacist before using salt substitutes.  Eat lower-sodium products, often labeled as "lower sodium" or "no salt added."  Eat fresh foods.  Eat more vegetables, fruits, and low-fat dairy products.  Choose whole grains. Look for the word "whole" as the first word in the ingredient list.  Choose fish and skinless chicken or turkey more often than red meat. Limit fish, poultry, and meat to 6 oz (170 g) each day.  Limit sweets, desserts, sugars, and sugary drinks.  Choose heart-healthy fats.  Limit cheese to 1 oz (28 g) per day.  Eat more home-cooked food and less restaurant, buffet, and fast food.  Limit fried foods.  Cook foods using methods other than frying.  Limit canned vegetables. If you do use them, rinse them well to decrease the sodium.  When eating at a restaurant, ask that your food be prepared with less salt, or no salt if possible. WHAT FOODS CAN I EAT? Seek help from a dietitian for individual  calorie needs. Grains Whole grain or whole wheat bread. Brown rice. Whole grain or whole wheat pasta. Quinoa, bulgur, and whole grain cereals. Low-sodium cereals. Corn or whole wheat flour tortillas. Whole grain cornbread. Whole grain crackers. Low-sodium crackers. Vegetables Fresh or frozen vegetables (raw, steamed, roasted, or grilled). Low-sodium or reduced-sodium tomato and vegetable juices. Low-sodium or reduced-sodium tomato sauce and paste. Low-sodium or reduced-sodium canned vegetables.  Fruits All fresh, canned (in natural juice), or frozen fruits. Meat and Other Protein Products Ground beef (85% or leaner), grass-fed beef, or beef trimmed of fat. Skinless chicken or turkey. Ground chicken or turkey. Pork trimmed of fat. All fish and seafood. Eggs. Dried beans, peas, or lentils. Unsalted nuts and seeds. Unsalted canned beans. Dairy Low-fat dairy products, such as skim or 1% milk, 2% or reduced-fat cheeses, low-fat ricotta or cottage cheese, or plain low-fat yogurt. Low-sodium or reduced-sodium cheeses. Fats and Oils Tub margarines without trans fats. Light or reduced-fat mayonnaise and salad dressings (reduced sodium). Avocado. Safflower, olive, or canola oils. Natural peanut or almond butter. Other Unsalted popcorn and pretzels. The items listed above may not be a complete list of recommended foods or beverages. Contact your dietitian for more options. WHAT FOODS ARE NOT RECOMMENDED? Grains White bread.   White pasta. White rice. Refined cornbread. Bagels and croissants. Crackers that contain trans fat. Vegetables Creamed or fried vegetables. Vegetables in a cheese sauce. Regular canned vegetables. Regular canned tomato sauce and paste. Regular tomato and vegetable juices. Fruits Dried fruits. Canned fruit in light or heavy syrup. Fruit juice. Meat and Other Protein Products Fatty cuts of meat. Ribs, chicken wings, bacon, sausage, bologna, salami, chitterlings, fatback, hot dogs,  bratwurst, and packaged luncheon meats. Salted nuts and seeds. Canned beans with salt. Dairy Whole or 2% milk, cream, half-and-half, and cream cheese. Whole-fat or sweetened yogurt. Full-fat cheeses or blue cheese. Nondairy creamers and whipped toppings. Processed cheese, cheese spreads, or cheese curds. Condiments Onion and garlic salt, seasoned salt, table salt, and sea salt. Canned and packaged gravies. Worcestershire sauce. Tartar sauce. Barbecue sauce. Teriyaki sauce. Soy sauce, including reduced sodium. Steak sauce. Fish sauce. Oyster sauce. Cocktail sauce. Horseradish. Ketchup and mustard. Meat flavorings and tenderizers. Bouillon cubes. Hot sauce. Tabasco sauce. Marinades. Taco seasonings. Relishes. Fats and Oils Butter, stick margarine, lard, shortening, ghee, and bacon fat. Coconut, palm kernel, or palm oils. Regular salad dressings. Other Pickles and olives. Salted popcorn and pretzels. The items listed above may not be a complete list of foods and beverages to avoid. Contact your dietitian for more information. WHERE CAN I FIND MORE INFORMATION? National Heart, [employer], and [employer] Institute: www.nhlbi.nih.gov/health/health-topics/topics/dash/ Document Released: 07/12/2011 Document Revised: 12/07/2013 Document Reviewed: 05/27/2013 Exit[employer] Patient Information 2015 [employer]. This information is not intended to replace advice given to you by your health care provider. Make sure you discuss any questions you have with your health care provider.  

## 2015-05-30 ENCOUNTER — Emergency Department (HOSPITAL_BASED_OUTPATIENT_CLINIC_OR_DEPARTMENT_OTHER)
Admission: EM | Admit: 2015-05-30 | Discharge: 2015-05-30 | Discharge status: Against Medical Advice | Payer: Medicaid Other | Attending: Emergency Medicine | Admitting: Emergency Medicine

## 2015-05-30 ENCOUNTER — Encounter (HOSPITAL_BASED_OUTPATIENT_CLINIC_OR_DEPARTMENT_OTHER): Payer: Self-pay | Admitting: *Deleted

## 2015-05-30 DIAGNOSIS — R110 Nausea: Secondary | ICD-10-CM | POA: Insufficient documentation

## 2015-05-30 DIAGNOSIS — I10 Essential (primary) hypertension: Secondary | ICD-10-CM | POA: Insufficient documentation

## 2015-05-30 DIAGNOSIS — Z720 Tobacco use: Secondary | ICD-10-CM | POA: Insufficient documentation

## 2015-05-30 HISTORY — DX: Essential (primary) hypertension: I10

## 2015-05-30 NOTE — ED Notes (Signed)
Pt. Reports she was placed on High B/P meds the last time she was here and has not been taking it every day like she is suppose to.

## 2015-05-30 NOTE — ED Notes (Signed)
Pt. Reports she started feeling sleepy all the time on Friday and she has been nauseated since Friday.

## 2015-07-12 ENCOUNTER — Encounter (HOSPITAL_BASED_OUTPATIENT_CLINIC_OR_DEPARTMENT_OTHER): Payer: Self-pay | Admitting: Emergency Medicine

## 2015-07-12 ENCOUNTER — Emergency Department (HOSPITAL_BASED_OUTPATIENT_CLINIC_OR_DEPARTMENT_OTHER)
Admission: EM | Admit: 2015-07-12 | Discharge: 2015-07-12 | Disposition: A | Discharge status: Home / Self Care | Payer: Medicaid Other | Attending: Emergency Medicine | Admitting: Emergency Medicine

## 2015-07-12 DIAGNOSIS — Z79899 Other long term (current) drug therapy: Secondary | ICD-10-CM | POA: Insufficient documentation

## 2015-07-12 DIAGNOSIS — I10 Essential (primary) hypertension: Secondary | ICD-10-CM | POA: Insufficient documentation

## 2015-07-12 DIAGNOSIS — N3000 Acute cystitis without hematuria: Secondary | ICD-10-CM | POA: Insufficient documentation

## 2015-07-12 DIAGNOSIS — F17210 Nicotine dependence, cigarettes, uncomplicated: Secondary | ICD-10-CM | POA: Insufficient documentation

## 2015-07-12 DIAGNOSIS — R531 Weakness: Secondary | ICD-10-CM | POA: Insufficient documentation

## 2015-07-12 DIAGNOSIS — Z3202 Encounter for pregnancy test, result negative: Secondary | ICD-10-CM | POA: Insufficient documentation

## 2015-07-12 LAB — CBC WITH DIFFERENTIAL/PLATELET
Basophils Absolute: 0 10*3/uL (ref 0.0–0.1)
Basophils Relative: 1 %
EOS ABS: 0.2 10*3/uL (ref 0.0–0.7)
EOS PCT: 3 %
HCT: 32.7 % — ABNORMAL LOW (ref 36.0–46.0)
Hemoglobin: 10.6 g/dL — ABNORMAL LOW (ref 12.0–15.0)
LYMPHS ABS: 2.8 10*3/uL (ref 0.7–4.0)
LYMPHS PCT: 56 %
MCH: 28.3 pg (ref 26.0–34.0)
MCHC: 32.4 g/dL (ref 30.0–36.0)
MCV: 87.2 fL (ref 78.0–100.0)
MONO ABS: 0.4 10*3/uL (ref 0.1–1.0)
MONOS PCT: 7 %
Neutro Abs: 1.7 10*3/uL (ref 1.7–7.7)
Neutrophils Relative %: 33 %
PLATELETS: 261 10*3/uL (ref 150–400)
RBC: 3.75 MIL/uL — ABNORMAL LOW (ref 3.87–5.11)
RDW: 13.6 % (ref 11.5–15.5)
WBC: 5.1 10*3/uL (ref 4.0–10.5)

## 2015-07-12 LAB — URINE MICROSCOPIC-ADD ON

## 2015-07-12 LAB — COMPREHENSIVE METABOLIC PANEL
ALT: 11 U/L — AB (ref 14–54)
AST: 18 U/L (ref 15–41)
Albumin: 3.9 g/dL (ref 3.5–5.0)
Alkaline Phosphatase: 49 U/L (ref 38–126)
Anion gap: 6 (ref 5–15)
BUN: 14 mg/dL (ref 6–20)
CHLORIDE: 103 mmol/L (ref 101–111)
CO2: 29 mmol/L (ref 22–32)
CREATININE: 0.78 mg/dL (ref 0.44–1.00)
Calcium: 8.9 mg/dL (ref 8.9–10.3)
GFR calc non Af Amer: >60 mL/min (ref >60)
GLUCOSE: 108 mg/dL — AB (ref 65–99)
POTASSIUM: 3.8 mmol/L (ref 3.5–5.1)
SODIUM: 138 mmol/L (ref 135–145)
Total Bilirubin: 0.4 mg/dL (ref 0.3–1.2)
Total Protein: 6.8 g/dL (ref 6.5–8.1)

## 2015-07-12 LAB — URINALYSIS, ROUTINE W REFLEX MICROSCOPIC
BILIRUBIN URINE: NEGATIVE
GLUCOSE, UA: NEGATIVE mg/dL
Ketones, ur: NEGATIVE mg/dL
Nitrite: NEGATIVE
PROTEIN: NEGATIVE mg/dL
Specific Gravity, Urine: 1.029 (ref 1.005–1.030)
pH: 6 (ref 5.0–8.0)

## 2015-07-12 LAB — PREGNANCY, URINE: PREG TEST UR: NEGATIVE

## 2015-07-12 MED ORDER — NITROFURANTOIN MONOHYD MACRO 100 MG PO CAPS: 100.0000 mg | ORAL_CAPSULE | Freq: Two times a day (BID) | ORAL | Status: DC

## 2015-07-12 NOTE — ED Provider Notes (Signed)
CSN: [id number]     Arrival date & time 07/12/15  1526 History   First MD Initiated Contact with Patient 07/12/15 1542     Chief Complaint  Patient presents with  . Fatigue     (Consider location/radiation/quality/duration/timing/severity/associated sxs/prior Treatment) HPI Complains of fatigue,, feeling tired urinary frequency and pain at plantar surface of right foot started 5 days ago.. No other associated symptoms no chest pain no shortness of breath no fever no abdominal pain. Patient stopped taking her HCTZ prescribed 2 weeks ago. She also complains of pain at plantar surface of right foot for the past 5 days which is worse with bearing weight and improved with nonweightbearing. No trauma no other associated symptoms. Pain is mild at present Past Medical History  Diagnosis Date  . Seasonal allergies   . Hypertension    Past Surgical History  Procedure Laterality Date  . Dilation and curettage of uterus     History reviewed. No pertinent family history. Social History  Substance Use Topics  . Smoking status: Current Every Day Smoker -- 1.00 packs/day    Types: Cigarettes  . Smokeless tobacco: None  . Alcohol Use: Yes     Comment: rarely   quit smoking 1 month ago. No drug use OB History    Gravida Para Term Preterm AB TAB SAB Ectopic Multiple Living   1              Review of Systems  Constitutional: Positive for fatigue.  Genitourinary: Positive for frequency.  Neurological: Positive for weakness.  All other systems reviewed and are negative.     Allergies  Review of patient's allergies indicates no known allergies.  Home Medications   Prior to Admission medications   Medication Sig Start Date End Date Taking? Authorizing Provider  hydrochlorothiazide (HYDRODIURIL) 25 MG tablet Take 1 tablet (25 mg total) by mouth daily. 04/02/15   Nelva Nayobert Beaton, MD   BP 145/100 mmHg  Pulse 82  Temp(Src) 98 F (36.7 C) (Oral)  Resp 18  Ht 6\' 1"  (1.854 m)  Wt 186 lb  (84.369 kg)  BMI 24.55 kg/m2  SpO2 99%  LMP 07/11/2015 Physical Exam  Constitutional: She is oriented to person, place, and time. She appears well-developed and well-nourished.  HENT:  Head: Normocephalic and atraumatic.  Eyes: Conjunctivae are normal. Pupils are equal, round, and reactive to light.  Neck: Neck supple. No tracheal deviation present. No thyromegaly present.  Cardiovascular: Normal rate and regular rhythm.   No murmur heard. Pulmonary/Chest: Effort normal and breath sounds normal.  Abdominal: Soft. Bowel sounds are normal. She exhibits no distension. There is no tenderness.  Musculoskeletal: Normal range of motion. She exhibits no edema or tenderness.  All 4 extremities without redness or tenderness neurovascular intact  Neurological: She is alert and oriented to person, place, and time. No cranial nerve deficit. Coordination normal.  Skin: Skin is warm and dry. No rash noted.  Psychiatric: She has a normal mood and affect.  Nursing note and vitals reviewed.   ED Course  Procedures (including critical care time) Labs Review Labs Reviewed - No data to display  Imaging Review No results found. I have personally reviewed and evaluated these images and lab results as part of my medical decision-making.   EKG Interpretation None     Declines pain medicine MDM  Patient feels that her right foot pain is secondary to wearing improper shoes. In light of urinary frequency will treat for urinary tract infection. Prescription Macrobid. Referral [facility] and resource referral to get primary care physician. Anemia is chronic Final diagnoses:  None   diagnosis #1 urinary tract infection #2 fatigue #3 anemia #4 right foot pain      Doug Sou, MD 07/12/15 1705

## 2015-07-12 NOTE — ED Notes (Signed)
Patient recently was started on HCTZ for her blood pressure. Reports that she just "finished my first bottle"

## 2015-07-12 NOTE — Discharge Instructions (Signed)
Urinary Tract Infection Try wearing different shoes to see if that helps your foot pain. Take Tylenol if needed as directed for pain. Take the antibiotic prescribed.  Yourlab work shows that you have a urine infection. You are also mildly anemic. Hemoglobin today is 10.6 which is slightly low. [facility] tomorrow to get a primary care physician or any of the numbers on the resource guide. Return if you feel worse for any reason. Urinary tract infections (UTIs) can develop anywhere along your urinary tract. Your urinary tract is your body's drainage system for removing wastes and extra water. Your urinary tract includes two kidneys, two ureters, a bladder, and a urethra. Your kidneys are a pair of bean-shaped organs. Each kidney is about the size of your fist. They are located below your ribs, one on each side of your spine. CAUSES Infections are caused by microbes, which are microscopic organisms, including fungi, viruses, and bacteria. These organisms are so small that they can only be seen through a microscope. Bacteria are the microbes that most commonly cause UTIs. SYMPTOMS  Symptoms of UTIs may vary by age and gender of the patient and by the location of the infection. Symptoms in young women typically include a frequent and intense urge to urinate and a painful, burning feeling in the bladder or urethra during urination. Older women and men are more likely to be tired, shaky, and weak and have muscle aches and abdominal pain. A fever may mean the infection is in your kidneys. Other symptoms of a kidney infection include pain in your back or sides below the ribs, nausea, and vomiting. DIAGNOSIS To diagnose a UTI, your caregiver will ask you about your symptoms. Your caregiver will also ask you to provide a urine sample. The urine sample will be tested for bacteria and white blood cells. White blood cells are made by your body to help fight infection. TREATMENT    Typically, UTIs can be treated with medication. Because most UTIs are caused by a bacterial infection, they usually can be treated with the use of antibiotics. The choice of antibiotic and length of treatment depend on your symptoms and the type of bacteria causing your infection. HOME CARE INSTRUCTIONS  If you were prescribed antibiotics, take them exactly as your caregiver instructs you. Finish the medication even if you feel better after you have only taken some of the medication.  Drink enough water and fluids to keep your urine clear or pale yellow.  Avoid caffeine, tea, and carbonated beverages. They tend to irritate your bladder.  Empty your bladder often. Avoid holding urine for long periods of time.  Empty your bladder before and after sexual intercourse.  After a bowel movement, women should cleanse from front to back. Use each tissue only once. SEEK MEDICAL CARE IF:   You have back pain.  You develop a fever.  Your symptoms do not begin to resolve within 3 days. SEEK IMMEDIATE MEDICAL CARE IF:   You have severe back pain or lower abdominal pain.  You develop chills.  You have nausea or vomiting.  You have continued burning or discomfort with urination. MAKE SURE YOU:   Understand these instructions.  Will watch your condition.  Will get help right away if you are not doing well or get worse.   This information is not intended to replace advice given to you by your health care provider. Make sure you discuss any questions you have with your health care provider.  Document Released: 05/02/2005 Document Revised: 04/13/2015 Document Reviewed: 08/31/2011 Elsevier Interactive Patient Education 2016 [employer].  Emergency Department Resource Guide 1) Find a Doctor and Pay Out of Pocket Although you won't have to find out who is covered by your insurance plan, it is a good idea to ask around and get recommendations. You will then need to call the office and see  if the doctor you have chosen will accept you as a new patient and what types of options they offer for patients who are self-pay. Some doctors offer discounts or will set up payment plans for their patients who do not have insurance, but you will need to ask so you aren't surprised when you get to your appointment.  2) Contact Your Local Health Department Not all health departments have doctors that can see patients for sick visits, but many do, so it is worth a call to see if yours does. If you don't know where your local health department is, you can check in your phone book. The CDC also has a tool to help you locate your state's health department, and many state websites also have listings of all of their local health departments.  3) Find a Walk-in Clinic If your illness is not likely to be very severe or complicated, you may want to try a walk in clinic. These are popping up all over the country in pharmacies, drugstores, and shopping centers. They're usually staffed by nurse practitioners or physician assistants that have been trained to treat common illnesses and complaints. They're usually fairly quick and inexpensive. However, if you have serious medical issues or chronic medical problems, these are probably not your best option.  No Primary Care Doctor: - Call Health Connect at  [phone] - they can help you locate a primary care doctor that  accepts your insurance, provides certain services, etc. - Physician Referral Service- [phone]  Chronic Pain Problems: Organization         Address  Phone   Notes  [facility]  [phone] Patients need to be referred by their primary care doctor.   Medication Assistance: Organization         Address  Phone   Notes  [facility] Medication [facility] [address] [place] [place], [place] [address] [phone] --Must be a resident of [facility] -- Must have NO insurance coverage whatsoever (no  Medicaid/ Medicare, etc.) -- The pt. MUST have a primary care doctor that directs their care regularly and follows them in the community   [facility]  [phone]   [employer]  [phone]    Agencies that provide inexpensive medical care: Organization         Address  Phone   Notes  [facility]  [phone]   [facility]    [phone]   [facility] [address] [place], [place] [address] [phone]   [facility] [address] [place], [place] [phone]   Planned Parenthood    [phone]   [facility]    [phone]   [facility]  [address], [place] Phone:  [phone], Fax:  [phone] Hours of Operation:  9 am - 6 pm, M-F.  Also accepts Medicaid/Medicare and self-pay.  [facility]  [address], [place] Phone: [phone], Fax: [phone]. Hours of Operation:  8:30 am - 5:30 pm, M-F.  Also accepts Medicaid and self-pay.  [facility] [place] [address] [place] Phone: [phone]   [facility] [address] Natasha Bence [place], [place] [phone], Ext. [phone] Mondays & Thursdays: 7-9 AM.  First 15 patients are seen on a first come, first serve basis.    Medicaid-accepting [facility] Providers:  Organization         Address  Phone   Notes  [facility] [address], [place] [phone] Also accepts self-pay patients.  [facility] [address] Laurell Josephs [place], [place]  [phone]   [facility] [address], [place] [phone]   [facility] [address] [place] [phone]   Renaye Rakers [address], [place]   [phone] Only accepts [place] Access [place] patients after they have their name applied to their card.    Self-Pay (no insurance) in [facility]:  Organization         Address  Phone   Notes  Sickle Cell Patients, [facility] [address] [place], [place] [phone]   [facility] [address] [place], [place] [phone]   [facility] [place]  [address], [place] [phone]   [facility]/Dr. Osei-Bonsu  [address], [place] or [address], [place] [phone] Phone number for both [place] and [place] locations is the same.  [facility] [address], [place] [phone]   [facility] [address], [place] or [address] [phone]   [facility] [address] [place] [phone], phone; [phone], fax Sees patients 1st and 3rd Saturday of every month.  Must not qualify for public or private insurance (i.e. Medicaid, Medicare, [place] Health Choice, Veterans' Benefits)  Household income should be no more than 200% of the poverty level The clinic cannot treat you if you are pregnant or think you are pregnant  Sexually transmitted diseases are not treated at the clinic.    Dental Care: Organization         Address  Phone  Notes  [facility] [address] [place], [place] [phone] Accepts children up to age 76 who are enrolled in [place] or [place] Health Choice; pregnant women with a Medicaid card; and children who have applied for Medicaid or [place] Health Choice, but were declined, whose parents can pay a reduced fee at time of service.  [facility] Department of [facility]  [address], [place] [phone] Accepts children up to age 31 who are enrolled in [place] or [place] Health Choice; pregnant women with a Medicaid card; and children who have applied for Medicaid or [place] Health Choice,  but were declined, whose parents can pay a reduced fee at time of service.  [facility]  [address] [place], [place] [phone] Patients are seen by appointment only. Walk-ins are not accepted. [facility] will see patients 37 years of age and older. Monday - Tuesday (8am-5pm) Most Wednesdays (8:30-5pm) $30 per visit, cash only  [facility]  [address], [facility] [phone] Patients are seen by appointment  only. Walk-ins are not accepted. [facility] will see patients 24 years of age and older. One Wednesday Evening (Monthly: Volunteer Based).  $30 per visit, cash only  [facility] [facility]  [phone] for adults; Children under age 174, call Graduate Pediatric Dentistry at [phone]. Children aged 374-14, please call [phone] to request a pediatric application.  Dental services are provided in all areas of dental care including fillings, crowns and bridges, complete and partial dentures, implants, gum treatment, root canals, and extractions. Preventive care is also provided. Treatment is provided to both adults and children. Patients are selected via a lottery and there is often a waiting list.   [facility] [address], [place]  [phone] [email or web address]   [facility] [address], [place], [place] [phone], Ext. [phone] Second and Fourth Thursday of each month, opens at 6:30 AM; Clinic ends at 9 AM.  Patients are seen on a first-come first-served basis, and a limited number are seen during each clinic.   [facility]  [address], [place], [place] [phone]   Eligibility Requirements You must have lived in [place], [place], or [place] counties for at least the last three months.   You cannot be eligible for state or federal sponsored [place]healthcare insurance, including CIGNAVeterans Administration, [place]icaid, or [employer]icare.   You generally  cannot be eligible for healthcare insurance through your employer.    How to apply: Eligibility screenings are held every Tuesday and Wednesday afternoon from 1:00 pm until 4:00 pm. You do not need an appointment for the interview!  [facility] [address], [place], [place] [phone]   [facility]  [phone]   [facility]  [phone]   [facility]  [phone]    Behavioral Health Resources in the Community: Intensive Outpatient Programs Organization         Address  Phone  Notes  [facility] [address], [place], [place] [phone]   [facility] [address], [place], [place] [phone]   [facility] [address], [place], [place]  [phone]   [facility] [address],  [place], [place] [phone] or [phone]   Substance Abuse Resources Organization         Address  Phone  Notes  [facility]  [phone]   [facility]  [phone]   [facility]  [phone]   [facility]  [phone]   Residential & Outpatient Substance Abuse Program  [phone]   Psychological Services Organization         Address  Phone  Notes  [facility]  [phone]- [phone]   [facility]  [phone]   [facility] [address], [place] [phone] or [phone]    Mobile Crisis Teams Organization         Address  Phone  Notes  [facility], Mobile Crisis Care Unit  [phone]   [facility]  [address] [place], [place] [phone]   Doristine LocksSharon DeEsch [address] [place] [phone]    Self-Help/Support Groups Organization         Address  Phone             Notes  [facility] [place] - variety of support groups  [phone] Call for more  information  Narcotics Anonymous (NA), Caring [facility] [address], [employer] [place]  2 meetings at this location   Statistician         Address  Phone  Notes  [facility] [address],    [place]  [phone]   [facility]  [address] [place] [address], [place], [place] [phone]   [facility] [address] [place], [place] [phone] Admissions: 8am-3pm M-F  [facility] [address],    [place], [place] [phone]   [facility] [address] [place], [place], [place] [phone]   [facility] [address],  [place], [place] [phone]   [facility] - Intensive Outpatient [address]urell Josephs [address], [place], [place] [phone]   [facility] ([facility]iction [facility] [address] [place]  [place], [place] [phone] or [phone]   [facility] ([facility]) [address] [place], [place] [phone] Accepts Medicaid  [facility] [address],  [place] [phone] Substance Abuse/Addiction Treatment   [facility] Organization         Address  Phone  Notes  [facility]  [phone]   Angie Fava, PhD [address] Ervin Knack [place], [place]   [phone] or [phone]   [facility]   [address] [place], [place] [phone]   [facility] [address] [place], [place] [phone] Insurance/Medicaid/sponsorship through [facility] and Families [address]                                    [place], [place] [phone] Therapy/tele-psych/case  [facility] [address], [place] [phone]    Dr. Lolly Mustache  [phone]   [facility]  [employer] [facility] Dept. 1) [address], [place] 2) [address] [place] 3)  [address], [place] [phone]  [phone]   [facility] Child Abuse Hotline 250-888-1663 or [phone] (After Hours)

## 2015-07-12 NOTE — ED Notes (Signed)
Patient states that she has had been tired all week, dizziness. Patient reports that she is eating non stop and urinating frequently. Patient states that with exertion she has SOB

## 2015-09-10 ENCOUNTER — Encounter (HOSPITAL_BASED_OUTPATIENT_CLINIC_OR_DEPARTMENT_OTHER): Payer: Self-pay | Admitting: *Deleted

## 2015-09-10 ENCOUNTER — Emergency Department (HOSPITAL_BASED_OUTPATIENT_CLINIC_OR_DEPARTMENT_OTHER)
Admission: EM | Admit: 2015-09-10 | Discharge: 2015-09-10 | Disposition: A | Discharge status: Home / Self Care | Payer: Medicaid Other | Attending: Emergency Medicine | Admitting: Emergency Medicine

## 2015-09-10 DIAGNOSIS — Y9289 Other specified places as the place of occurrence of the external cause: Secondary | ICD-10-CM | POA: Insufficient documentation

## 2015-09-10 DIAGNOSIS — J3489 Other specified disorders of nose and nasal sinuses: Secondary | ICD-10-CM

## 2015-09-10 DIAGNOSIS — Z87891 Personal history of nicotine dependence: Secondary | ICD-10-CM | POA: Insufficient documentation

## 2015-09-10 DIAGNOSIS — Y9389 Activity, other specified: Secondary | ICD-10-CM | POA: Insufficient documentation

## 2015-09-10 DIAGNOSIS — Z79899 Other long term (current) drug therapy: Secondary | ICD-10-CM | POA: Insufficient documentation

## 2015-09-10 DIAGNOSIS — I10 Essential (primary) hypertension: Secondary | ICD-10-CM | POA: Insufficient documentation

## 2015-09-10 DIAGNOSIS — W51XXXA Accidental striking against or bumped into by another person, initial encounter: Secondary | ICD-10-CM | POA: Insufficient documentation

## 2015-09-10 DIAGNOSIS — Y998 Other external cause status: Secondary | ICD-10-CM | POA: Insufficient documentation

## 2015-09-10 DIAGNOSIS — S0992XA Unspecified injury of nose, initial encounter: Secondary | ICD-10-CM | POA: Insufficient documentation

## 2015-09-10 MED ORDER — MUPIROCIN CALCIUM 2 % NA OINT: TOPICAL_OINTMENT | NASAL | Status: DC

## 2015-09-10 NOTE — ED Notes (Signed)
Reports her nephew bumped her in the nose on Wednesday where she has a nose piercing. Noticed a bump yesterday and tried to pop it. C/o soreness to right nostril

## 2015-09-10 NOTE — Discharge Instructions (Signed)
The bump on your nose could be due to infection or it could also be a keloid. Please apply the antibiotic cream daily for the next week to treat for possible infection. If no improvement you may follow-up with Dr. Earlie Server (plastic surgeon) for further evaluation.

## 2015-09-10 NOTE — ED Provider Notes (Signed)
CSN: [id number]     Arrival date & time 09/10/15  1050 History   First MD Initiated Contact with Patient 09/10/15 1109     Chief Complaint  Patient presents with  . Wound Check     (Consider location/radiation/quality/duration/timing/severity/associated sxs/prior Treatment) HPI   25 year old female presenting complaining of pain to the nose. Patient states she had her nose pierced 2 months ago and it has been doing fine. 4 days ago her nephew accidentally bumped her in her nose and since then she has had pain and swelling around the piercing site. She has not noticed any drainage. She has tried using alcohol wipes, tea tree oil without relief. No nosebleed and no rash.   Past Medical History  Diagnosis Date  . Seasonal allergies   . Hypertension    Past Surgical History  Procedure Laterality Date  . Dilation and curettage of uterus     No family history on file. Social History  Substance Use Topics  . Smoking status: Former Smoker -- 1.00 packs/day    Types: Cigarettes  . Smokeless tobacco: None  . Alcohol Use: Yes     Comment: rarely   OB History    Gravida Para Term Preterm AB TAB SAB Ectopic Multiple Living   1              Review of Systems  Constitutional: Negative for fever.  Skin: Positive for wound.      Allergies  Review of patient's allergies indicates no known allergies.  Home Medications   Prior to Admission medications   Medication Sig Start Date End Date Taking? Authorizing Provider  hydrochlorothiazide (HYDRODIURIL) 25 MG tablet Take 1 tablet (25 mg total) by mouth daily. 04/02/15   Nelva Nay, MD  nitrofurantoin, macrocrystal-monohydrate, (MACROBID) 100 MG capsule Take 1 capsule (100 mg total) by mouth 2 (two) times daily. 5 days 07/12/15   Doug Sou, MD   BP 132/90 mmHg  Pulse 71  Temp(Src) 98 F (36.7 C) (Oral)  Resp 18  Ht  (1.854 m)  Wt 96.163 kg  BMI 27.98 kg/m2  SpO2 100%  LMP 08/28/2015  Breastfeeding? No Physical Exam   Constitutional: She appears well-developed and well-nourished. No distress.  HENT:  Head: Atraumatic.  Patient has piercing to her right nares with a small less than 2 mm papule adjacent to the insertion site. It is nondraining with no surrounding skin erythema but tender to palpation.  Eyes: Conjunctivae are normal.  Neck: Neck supple.  Neurological: She is alert.  Skin: No rash noted.  Psychiatric: She has a normal mood and affect.  Nursing note and vitals reviewed.   ED Course  Procedures (including critical care time) Labs Review Labs Reviewed - No data to display  Imaging Review No results found. I have personally reviewed and evaluated these images and lab results as part of my medical decision-making.   EKG Interpretation None      MDM   Final diagnoses:  Nose pain    BP 132/90 mmHg  Pulse 71  Temp(Src) 98 F (36.7 C) (Oral)  Resp 18  Ht  (1.854 m)  Wt 96.163 kg  BMI 27.98 kg/m2  SpO2 100%  LMP 08/28/2015  Breastfeeding? No   Patient has piercing to her right nares with a small less than 2 mm papule adjacent to the insertion site. It is nondraining with no surrounding skin erythema but tender to palpation. this could be a developing infection versus a keloid. Doubt a big  enough abscess for I&D. I will provide an antibiotic cream and recommend warm compress and the meantime in case it is an infection. Patient does not want to remove her nose ring.    Fayrene Helper, PA-C 09/10/15 1133  Glynn Octave, MD 09/10/15 1146

## 2016-01-05 ENCOUNTER — Encounter (HOSPITAL_BASED_OUTPATIENT_CLINIC_OR_DEPARTMENT_OTHER): Payer: Self-pay | Admitting: Emergency Medicine

## 2016-01-05 ENCOUNTER — Emergency Department (HOSPITAL_BASED_OUTPATIENT_CLINIC_OR_DEPARTMENT_OTHER)
Admission: EM | Admit: 2016-01-05 | Discharge: 2016-01-05 | Disposition: A | Discharge status: Home / Self Care | Payer: Medicaid Other | Attending: Emergency Medicine | Admitting: Emergency Medicine

## 2016-01-05 DIAGNOSIS — O26891 Other specified pregnancy related conditions, first trimester: Secondary | ICD-10-CM | POA: Diagnosis present

## 2016-01-05 DIAGNOSIS — Z3A01 Less than 8 weeks gestation of pregnancy: Secondary | ICD-10-CM | POA: Insufficient documentation

## 2016-01-05 DIAGNOSIS — O99891 Other specified diseases and conditions complicating pregnancy: Secondary | ICD-10-CM

## 2016-01-05 DIAGNOSIS — O2341 Unspecified infection of urinary tract in pregnancy, first trimester: Secondary | ICD-10-CM | POA: Insufficient documentation

## 2016-01-05 DIAGNOSIS — I10 Essential (primary) hypertension: Secondary | ICD-10-CM | POA: Diagnosis not present

## 2016-01-05 DIAGNOSIS — Z87891 Personal history of nicotine dependence: Secondary | ICD-10-CM | POA: Insufficient documentation

## 2016-01-05 DIAGNOSIS — O9989 Other specified diseases and conditions complicating pregnancy, childbirth and the puerperium: Secondary | ICD-10-CM

## 2016-01-05 DIAGNOSIS — Z3490 Encounter for supervision of normal pregnancy, unspecified, unspecified trimester: Secondary | ICD-10-CM

## 2016-01-05 DIAGNOSIS — R8271 Bacteriuria: Secondary | ICD-10-CM | POA: Insufficient documentation

## 2016-01-05 LAB — URINALYSIS, ROUTINE W REFLEX MICROSCOPIC
Bilirubin Urine: NEGATIVE
Glucose, UA: NEGATIVE mg/dL
Ketones, ur: NEGATIVE mg/dL
Leukocytes, UA: NEGATIVE
Nitrite: NEGATIVE
Protein, ur: NEGATIVE mg/dL
Specific Gravity, Urine: 1.028 (ref 1.005–1.030)
pH: 6 (ref 5.0–8.0)

## 2016-01-05 LAB — PREGNANCY, URINE: Preg Test, Ur: POSITIVE — AB

## 2016-01-05 LAB — URINE MICROSCOPIC-ADD ON

## 2016-01-05 MED ORDER — PRENATAL COMPLETE 14-0.4 MG PO TABS: 1.0000 | ORAL_TABLET | Freq: Every day | ORAL | Status: DC

## 2016-01-05 MED ORDER — CEPHALEXIN 500 MG PO CAPS: 500.0000 mg | ORAL_CAPSULE | Freq: Two times a day (BID) | ORAL | Status: DC

## 2016-01-05 MED FILL — CEPHALEXIN 500 MG CAPSULE: 500 | 5 days supply | Qty: 10 | Fill #0

## 2016-01-05 MED FILL — PRENATAL VITAMIN PLUS LOW I: 27-1 | 60 days supply | Qty: 60 | Fill #0

## 2016-01-05 NOTE — ED Notes (Addendum)
Pt states she was awakened from sleep with L lower back pain and abd pain around the belly button. Pt denies N/V. Pt states her bowel movements have not looked normal in 6 months and she is concerned about this. Pt states it looks "stringy". Pt denies urinary symptoms.

## 2016-01-05 NOTE — ED Notes (Signed)
MD at bedside. 

## 2016-01-05 NOTE — Discharge Instructions (Signed)
Prenatal Care °WHAT IS PRENATAL CARE?  °Prenatal care is the process of caring for a pregnant woman before she gives birth. Prenatal care makes sure that she and her baby remain as healthy as possible throughout pregnancy. Prenatal care may be provided by a midwife, family practice health care provider, or a childbirth and pregnancy specialist (obstetrician). Prenatal care may include physical examinations, testing, treatments, and education on nutrition, lifestyle, and social support services. °WHY IS PRENATAL CARE SO IMPORTANT?  °Early and consistent prenatal care increases the chance that you and your baby will remain healthy throughout your pregnancy. This type of care also decreases a baby's risk of being born too early (prematurely), or being born smaller than expected (small for gestational age). Any underlying medical conditions you may have that could pose a risk during your pregnancy are discussed during prenatal care visits. You will also be monitored regularly for any new conditions that may arise during your pregnancy so they can be treated quickly and effectively. °WHAT HAPPENS DURING PRENATAL CARE VISITS? °Prenatal care visits may include the following: °Discussion °Tell your health care provider about any new signs or symptoms you have experienced since your last visit. These might include: °· Nausea or vomiting. °· Increased or decreased level of energy. °· Difficulty sleeping. °· Back or leg pain. °· Weight changes. °· Frequent urination. °· Shortness of breath with physical activity. °· Changes in your skin, such as the development of a rash or itchiness. °· Vaginal discharge or bleeding. °· Feelings of excitement or nervousness. °· Changes in your baby's movements. °You may want to write down any questions or topics you want to discuss with your health care provider and bring them with you to your appointment. °Examination °During your first prenatal care visit, you will likely have a complete  physical exam. Your health care provider will often examine your vagina, cervix, and the position of your uterus, as well as check your heart, lungs, and other body systems. As your pregnancy progresses, your health care provider will measure the size of your uterus and your baby's position inside your uterus. He or she may also examine you for early signs of labor. Your prenatal visits may also include checking your blood pressure and, after about 10-12 weeks of pregnancy, listening to your baby's heartbeat. °Testing °Regular testing often includes: °· Urinalysis. This checks your urine for glucose, protein, or signs of infection. °· Blood count. This checks the levels of white and red blood cells in your body. °· Tests for sexually transmitted infections (STIs). Testing for STIs at the beginning of pregnancy is routinely done and is required in many states. °· Antibody testing. You will be checked to see if you are immune to certain illnesses, such as rubella, that can affect a developing fetus. °· Glucose screen. Around 24-28 weeks of pregnancy, your blood glucose level will be checked for signs of gestational diabetes. Follow-up tests may be recommended. °· Group B strep. This is a bacteria that is commonly found inside a woman's vagina. This test will inform your health care provider if you need an antibiotic to reduce the amount of this bacteria in your body prior to labor and childbirth. °· Ultrasound. Many pregnant women undergo an ultrasound screening around 18-20 weeks of pregnancy to evaluate the health of the fetus and check for any developmental abnormalities. °· HIV (human immunodeficiency virus) testing. Early in your pregnancy, you will be screened for HIV. If you are at high risk for HIV, this test   may be repeated during your third trimester of pregnancy. °You may be offered other testing based on your age, personal or family medical history, or other factors.  °HOW OFTEN SHOULD I PLAN TO SEE MY  HEALTH CARE PROVIDER FOR PRENATAL CARE? °Your prenatal care check-up schedule depends on any medical conditions you have before, or develop during, your pregnancy. If you do not have any underlying medical conditions, you will likely be seen for checkups: °· Monthly, during the first 6 months of pregnancy. °· Twice a month during months 7 and 8 of pregnancy. °· Weekly starting in the 9th month of pregnancy and until delivery. °If you develop signs of early labor or other concerning signs or symptoms, you may need to see your health care provider more often. Ask your health care provider what prenatal care schedule is best for you. °WHAT CAN I DO TO KEEP MYSELF AND MY BABY AS HEALTHY AS POSSIBLE DURING MY PREGNANCY? °· Take a prenatal vitamin containing 400 micrograms (0.4 mg) of folic acid every day. Your health care provider may also ask you to take additional vitamins such as iodine, vitamin D, iron, copper, and zinc. °· Take 1500-2000 mg of calcium daily starting at your 20th week of pregnancy until you deliver your baby. °· Make sure you are up to date on your vaccinations. Unless directed otherwise by your health care provider: °¨ You should receive a tetanus, diphtheria, and pertussis (Tdap) vaccination between the 27th and 36th week of your pregnancy, regardless of when your last Tdap immunization occurred. This helps protect your baby from whooping cough (pertussis) after he or she is born. °¨ You should receive an annual inactivated influenza vaccine (IIV) to help protect you and your baby from influenza. This can be done at any point during your pregnancy. °· Eat a well-rounded diet that includes: °¨ Fresh fruits and vegetables. °¨ Lean proteins. °¨ Calcium-rich foods such as milk, yogurt, hard cheeses, and dark, leafy greens. °¨ Whole grain breads. °· Do not eat seafood high in mercury, including: °¨ Swordfish. °¨ Tilefish. °¨ Shark. °¨ King mackerel. °¨ More than 6 oz tuna per week. °· Do not eat: °¨ Raw  or undercooked meats or eggs. °¨ Unpasteurized foods, such as soft cheeses (brie, blue, or feta), juices, and milks. °¨ Lunch meats. °¨ Hot dogs that have not been heated until they are steaming. °· Drink enough water to keep your urine clear or pale yellow. For many women, this may be 10 or more 8 oz glasses of water each day. Keeping yourself hydrated helps deliver nutrients to your baby and may prevent the start of pre-term uterine contractions. °· Do not use any tobacco products including cigarettes, chewing tobacco, or electronic cigarettes. If you need help quitting, ask your health care provider. °· Do not drink beverages containing alcohol. No safe level of alcohol consumption during pregnancy has been determined. °· Do not use any illegal drugs. These can harm your developing baby or cause a miscarriage. °· Ask your health care provider or pharmacist before taking any prescription or over-the-counter medicines, herbs, or supplements. °· Limit your caffeine intake to no more than 200 mg per day. °· Exercise. Unless told otherwise by your health care provider, try to get 30 minutes of moderate exercise most days of the week. Do not  do high-impact activities, contact sports, or activities with a high risk of falling, such as horseback riding or downhill skiing. °· Get plenty of rest. °· Avoid anything that raises your   body temperature, such as hot tubs and saunas. °· If you own a cat, do not empty its litter box. Bacteria contained in cat feces can cause an infection called toxoplasmosis. This can result in serious harm to the fetus. °· Stay away from chemicals such as insecticides, lead, mercury, and cleaning or paint products that contain solvents. °· Do not have any X-rays taken unless medically necessary. °· Take a childbirth and breastfeeding preparation class. Ask your health care provider if you need a referral or recommendation. °  °This information is not intended to replace advice given to you by  your health care provider. Make sure you discuss any questions you have with your health care provider. °  °Document Released: 07/26/2003 Document Revised: 08/13/2014 Document Reviewed: 10/07/2013 °Elsevier Interactive Patient Education ©2016 [employer] ° °

## 2016-01-15 ENCOUNTER — Encounter (HOSPITAL_BASED_OUTPATIENT_CLINIC_OR_DEPARTMENT_OTHER): Payer: Self-pay | Admitting: *Deleted

## 2016-01-15 ENCOUNTER — Emergency Department (HOSPITAL_BASED_OUTPATIENT_CLINIC_OR_DEPARTMENT_OTHER)
Admission: EM | Admit: 2016-01-15 | Discharge: 2016-01-15 | Disposition: A | Discharge status: Home / Self Care | Payer: Medicaid Other | Attending: Emergency Medicine | Admitting: Emergency Medicine

## 2016-01-15 DIAGNOSIS — L299 Pruritus, unspecified: Secondary | ICD-10-CM | POA: Diagnosis present

## 2016-01-15 DIAGNOSIS — B373 Candidiasis of vulva and vagina: Secondary | ICD-10-CM | POA: Insufficient documentation

## 2016-01-15 DIAGNOSIS — I10 Essential (primary) hypertension: Secondary | ICD-10-CM | POA: Diagnosis not present

## 2016-01-15 DIAGNOSIS — Z87891 Personal history of nicotine dependence: Secondary | ICD-10-CM | POA: Diagnosis not present

## 2016-01-15 DIAGNOSIS — B3731 Acute candidiasis of vulva and vagina: Secondary | ICD-10-CM

## 2016-01-15 LAB — URINALYSIS, ROUTINE W REFLEX MICROSCOPIC
BILIRUBIN URINE: NEGATIVE
Glucose, UA: NEGATIVE mg/dL
Hgb urine dipstick: NEGATIVE
Ketones, ur: 15 mg/dL — AB
NITRITE: NEGATIVE
PH: 7.5 (ref 5.0–8.0)
Protein, ur: NEGATIVE mg/dL
SPECIFIC GRAVITY, URINE: 1.035 — AB (ref 1.005–1.030)

## 2016-01-15 LAB — WET PREP, GENITAL
CLUE CELLS WET PREP: NONE SEEN
SPERM: NONE SEEN
TRICH WET PREP: NONE SEEN

## 2016-01-15 LAB — URINE MICROSCOPIC-ADD ON

## 2016-01-15 MED ORDER — MICONAZOLE NITRATE 4 % VA CREA: 1.0000 | TOPICAL_CREAM | Freq: Every day | VAGINAL | Status: DC

## 2016-01-15 NOTE — ED Provider Notes (Signed)
CSN: [id number]     Arrival date & time 01/15/16  1347 History  By signing my name below, I, Soijett Blue, attest that this documentation has been prepared under the direction and in the presence of Tilden FossaElizabeth Julianah Marciel, MD. Electronically Signed: Soijett Blue, ED Scribe. 01/15/2016. 3:56 PM.   Chief Complaint  Patient presents with  . Vaginal Itching      The history is provided by the patient. No language interpreter was used.    HPI Comments: Kathryn Warren is a 25 y.o. female who presents to the Emergency Department complaining of vaginal itching onset 8-9 last night. Pt states that the last time that she was seen in the ED, she had a UTI that she completed her abx 6 days ago. Pt reports that she began to feel the vaginal itching following using a scented body wash. Denies having a yeast infection in the past. Pt is aware that she is pregnant and she doesn't find out how far along she is until her OB appointment on 01/23/2016. Patient's last menstrual period was 12/05/2015. She states that she has tried Rx abx with no relief for her symptoms. She denies vaginal burning sensation, fever, dysuria, vomiting, abdominal pain, and any other symptoms.    Past Medical History  Diagnosis Date  . Seasonal allergies   . Hypertension    Past Surgical History  Procedure Laterality Date  . Dilation and curettage of uterus     History reviewed. No pertinent family history. Social History  Substance Use Topics  . Smoking status: Former Smoker -- 1.00 packs/day    Types: Cigarettes  . Smokeless tobacco: None  . Alcohol Use: Yes     Comment: rarely   OB History    Gravida Para Term Preterm AB TAB SAB Ectopic Multiple Living   2              Review of Systems  Constitutional: Negative for fever.  Gastrointestinal: Negative for vomiting and abdominal pain.  Genitourinary: Negative for dysuria.       Vaginal itching without burning sensation  All other systems reviewed and are  negative.     Allergies  Review of patient's allergies indicates no known allergies.  Home Medications   Prior to Admission medications   Medication Sig Start Date End Date Taking? Authorizing Provider  MICONAZOLE NITRATE VAGINAL 4 % CREA Place 1 Applicatorful vaginally at bedtime. 01/15/16   Tilden FossaElizabeth Maritsa Hunsucker, MD  Prenatal Vit-Fe Fumarate-FA (PRENATAL COMPLETE) 14-0.4 MG TABS Take 1 tablet by mouth daily. 01/05/16   Raeford RazorStephen Kohut, MD   BP 138/79 mmHg  Pulse 72  Temp(Src) 98.6 F (37 C) (Oral)  Resp 18  Ht 6' (1.829 m)  Wt 225 lb (102.059 kg)  BMI 30.51 kg/m2  SpO2 97%  LMP 12/05/2015 Physical Exam  Constitutional: She is oriented to person, place, and time. She appears well-developed and well-nourished.  HENT:  Head: Normocephalic and atraumatic.  Cardiovascular: Normal rate and regular rhythm.   Pulmonary/Chest: Effort normal. No respiratory distress.  Abdominal: Soft. There is no tenderness. There is no rebound and no guarding.  Genitourinary:  Chaperone present for exam. Moderate amount of thick white discharge. No CMT. Os closed.   Musculoskeletal: She exhibits no edema or tenderness.  Neurological: She is alert and oriented to person, place, and time.  Skin: Skin is warm and dry.  Psychiatric: She has a normal mood and affect. Her behavior is normal.  Nursing note and vitals reviewed.   ED Course  Procedures (including critical care time) DIAGNOSTIC STUDIES: Oxygen Saturation is 99% on RA, nl by my interpretation.    COORDINATION OF CARE: 3:24 PM Discussed treatment plan with pt at bedside which includes UA and pelvic exam and pt agreed to plan.    Labs Review Labs Reviewed  WET PREP, GENITAL - Abnormal; Notable for the following:    Yeast Wet Prep HPF POC PRESENT (*)    WBC, Wet Prep HPF POC MANY (*)    All other components within normal limits  URINALYSIS, ROUTINE W REFLEX MICROSCOPIC (NOT AT [facility]) - Abnormal; Notable for the following:    APPearance CLOUDY  (*)    Specific Gravity, Urine 1.035 (*)    Ketones, ur 15 (*)    Leukocytes, UA SMALL (*)    All other components within normal limits  URINE MICROSCOPIC-ADD ON - Abnormal; Notable for the following:    Squamous Epithelial / LPF 6-30 (*)    Bacteria, UA MANY (*)    All other components within normal limits  URINE CULTURE  GC/CHLAMYDIA PROBE AMP ([place]) NOT AT [facility]    Imaging Review No results found. I have personally reviewed and evaluated these lab results as part of my medical decision-making.   EKG Interpretation None      MDM   Final diagnoses:  Vulvovaginal candidiasis   Pt here for vaginal discharge, recent abx use.  Exam c/w candidasis.  Discussed treatment at home, outpatient follow up, return precautions.  Current UA with bacteria present but also epithelials.  Her current sxs are not c/w UTI - will send cultures and only treat if cultures are positive.   I personally performed the services described in this documentation, which was scribed in my presence. The recorded information has been reviewed and is accurate.   Tilden Fossa, MD 01/16/16 1126

## 2016-01-15 NOTE — ED Notes (Signed)
Vaginal irritation and discharge x 1 week.

## 2016-01-15 NOTE — Discharge Instructions (Signed)

## 2016-01-17 LAB — GC/CHLAMYDIA PROBE AMP (~~LOC~~) NOT AT ARMC
CHLAMYDIA, DNA PROBE: NEGATIVE
NEISSERIA GONORRHEA: NEGATIVE

## 2016-01-17 LAB — URINE CULTURE

## 2016-01-17 NOTE — ED Provider Notes (Signed)
CSN: [id number]     Arrival date & time 01/05/16  0710 History   First MD Initiated Contact with Patient 01/05/16 [phone]     Chief Complaint  Patient presents with  . Abdominal Pain  . Back Pain     (Consider location/radiation/quality/duration/timing/severity/associated sxs/prior Treatment) HPI   25 year old female with some lower back and abdominal pain. Describes as cramps. She has some concern that she may be pregnant. She is sexually active. Periods are regular. She feels like her last was 3 weeks ago. She does not take a home pregnancy test. No urinary complaints. No unusual vaginal bleeding or discharge. No fevers or chills. No nausea or vomiting.  Past Medical History  Diagnosis Date  . Seasonal allergies   . Hypertension    Past Surgical History  Procedure Laterality Date  . Dilation and curettage of uterus     No family history on file. Social History  Substance Use Topics  . Smoking status: Former Smoker -- 1.00 packs/day    Types: Cigarettes  . Smokeless tobacco: None  . Alcohol Use: Yes     Comment: rarely   OB History    Gravida Para Term Preterm AB TAB SAB Ectopic Multiple Living   2              Review of Systems  All systems reviewed and negative, other than as noted in HPI.   Allergies  Review of patient's allergies indicates no known allergies.  Home Medications   Prior to Admission medications   Medication Sig Start Date End Date Taking? Authorizing Provider  MICONAZOLE NITRATE VAGINAL 4 % CREA Place 1 Applicatorful vaginally at bedtime. 01/15/16   Tilden Fossa, MD  Prenatal Vit-Fe Fumarate-FA (PRENATAL COMPLETE) 14-0.4 MG TABS Take 1 tablet by mouth daily. 01/05/16   Raeford Razor, MD   BP 136/99 mmHg  Pulse 75  Temp(Src) 98.1 F (36.7 C) (Oral)  Resp 16  Ht  (1.854 m)  Wt 221 lb (100.245 kg)  BMI 29.16 kg/m2  SpO2 100%  LMP 12/05/2015 Physical Exam  Constitutional: She appears well-developed and well-nourished. No distress.   HENT:  Head: Normocephalic and atraumatic.  Eyes: Conjunctivae are normal. Right eye exhibits no discharge. Left eye exhibits no discharge.  Neck: Neck supple.  Cardiovascular: Normal rate, regular rhythm and normal heart sounds.  Exam reveals no gallop and no friction rub.   No murmur heard. Pulmonary/Chest: Effort normal and breath sounds normal. No respiratory distress.  Abdominal: Soft. She exhibits no distension. There is no tenderness.  Musculoskeletal: She exhibits no edema or tenderness.  Neurological: She is alert.  Skin: Skin is warm and dry.  Psychiatric: She has a normal mood and affect. Her behavior is normal. Thought content normal.  Nursing note and vitals reviewed.   ED Course  Procedures (including critical care time) Labs Review Labs Reviewed  URINALYSIS, ROUTINE W REFLEX MICROSCOPIC (NOT AT [facility]) - Abnormal; Notable for the following:    APPearance CLOUDY (*)    Hgb urine dipstick SMALL (*)    All other components within normal limits  PREGNANCY, URINE - Abnormal; Notable for the following:    Preg Test, Ur POSITIVE (*)    All other components within normal limits  URINE MICROSCOPIC-ADD ON - Abnormal; Notable for the following:    Squamous Epithelial / LPF 6-30 (*)    Bacteria, UA MANY (*)    All other components within normal limits    Imaging Review No results found. I have  personally reviewed and evaluated these images and lab results as part of my medical decision-making.   EKG Interpretation None      MDM   Final diagnoses:  Pregnancy  Bacteriuria during pregnancy    25 year old female who is pregnant. Asymptomatic bacteriuria. Will treat..Emergent complication of pregnancy. OB follow-up.  Raeford RazorStephen Nefertiti Mohamad, MD 01/17/16 [phone]

## 2016-01-17 NOTE — ED Notes (Signed)
[facility] Miconazole Nitrate Vaginal 4% cream to 2%

## 2016-02-06 ENCOUNTER — Emergency Department (HOSPITAL_BASED_OUTPATIENT_CLINIC_OR_DEPARTMENT_OTHER)
Admission: EM | Admit: 2016-02-06 | Discharge: 2016-02-06 | Disposition: A | Discharge status: Home / Self Care | Payer: Medicaid Other | Attending: Emergency Medicine | Admitting: Emergency Medicine

## 2016-02-06 ENCOUNTER — Encounter (HOSPITAL_BASED_OUTPATIENT_CLINIC_OR_DEPARTMENT_OTHER): Payer: Self-pay

## 2016-02-06 DIAGNOSIS — Z3A10 10 weeks gestation of pregnancy: Secondary | ICD-10-CM | POA: Diagnosis not present

## 2016-02-06 DIAGNOSIS — Z79899 Other long term (current) drug therapy: Secondary | ICD-10-CM | POA: Insufficient documentation

## 2016-02-06 DIAGNOSIS — O2311 Infections of bladder in pregnancy, first trimester: Secondary | ICD-10-CM | POA: Diagnosis not present

## 2016-02-06 DIAGNOSIS — O219 Vomiting of pregnancy, unspecified: Secondary | ICD-10-CM | POA: Diagnosis present

## 2016-02-06 DIAGNOSIS — O131 Gestational [pregnancy-induced] hypertension without significant proteinuria, first trimester: Secondary | ICD-10-CM | POA: Diagnosis not present

## 2016-02-06 DIAGNOSIS — Z87891 Personal history of nicotine dependence: Secondary | ICD-10-CM | POA: Insufficient documentation

## 2016-02-06 DIAGNOSIS — N3000 Acute cystitis without hematuria: Secondary | ICD-10-CM

## 2016-02-06 LAB — URINALYSIS, ROUTINE W REFLEX MICROSCOPIC
Glucose, UA: NEGATIVE mg/dL
Ketones, ur: >80 mg/dL — AB
Nitrite: NEGATIVE
PROTEIN: 100 mg/dL — AB
SPECIFIC GRAVITY, URINE: 1.041 — AB (ref 1.005–1.030)
pH: 6 (ref 5.0–8.0)

## 2016-02-06 LAB — URINE MICROSCOPIC-ADD ON

## 2016-02-06 MED ORDER — METOCLOPRAMIDE HCL 10 MG PO TABS: 10.0000 mg | ORAL_TABLET | Freq: Four times a day (QID) | ORAL | Status: DC

## 2016-02-06 MED ORDER — ONDANSETRON 4 MG PO TBDP: 4.0000 mg | ORAL_TABLET | Freq: Three times a day (TID) | ORAL | Status: DC | PRN

## 2016-02-06 MED ORDER — METOCLOPRAMIDE HCL 5 MG/ML IJ SOLN
10.0000 mg | Freq: Once | INTRAMUSCULAR | Status: AC
  Administered 2016-02-06: 10 mg via INTRAVENOUS
  Filled 2016-02-06: qty 2

## 2016-02-06 MED ORDER — SODIUM CHLORIDE 0.9 % IV BOLUS (SEPSIS)
1000.0000 mL | Freq: Once | INTRAVENOUS | Status: AC
  Administered 2016-02-06: 1000 mL via INTRAVENOUS

## 2016-02-06 MED ORDER — NITROFURANTOIN MONOHYD MACRO 100 MG PO CAPS: 100.0000 mg | ORAL_CAPSULE | Freq: Two times a day (BID) | ORAL | Status: DC

## 2016-02-06 MED FILL — NITROFURANTOIN MONO-MCR 100: 100 | 7 days supply | Qty: 14 | Fill #0

## 2016-02-06 MED FILL — METOCLOPRAMIDE 10 MG TABLET: 10 | 8 days supply | Qty: 30 | Fill #0

## 2016-02-06 MED FILL — ONDANSETRON ODT 4 MG TABLET: 4 | 7 days supply | Qty: 20 | Fill #0

## 2016-02-06 NOTE — ED Notes (Signed)
Pt tolerated po challenge without emesis.  PA notified.

## 2016-02-06 NOTE — ED Notes (Signed)
Pt ambulatory to restroom for urine collection. 

## 2016-02-06 NOTE — ED Notes (Signed)
Pt reports currently [redacted] weeks pregnant and unable to hold food/liquids down.

## 2016-02-06 NOTE — Discharge Instructions (Signed)
You have been seen today for nausea and vomiting in pregnancy.   1. Nausea and vomiting: Nausea and vomiting in early pregnancy is common. Use the Reglan first, but if this fails to control vomiting use the Zofran. Follow up with OB/GYN as soon as possible on this matter. 2. Urinary tract infection: The urinalysis shows a UTI. You are being prescribed an antibiotic for this issue. Please take all of your antibiotics until finished!   You may develop abdominal discomfort or diarrhea from the antibiotic.  You may help offset this with probiotics which you can buy or get in yogurt. Do not eat or take the probiotics until 2 hours after your antibiotic. Return to ED should symptoms worsen.

## 2016-02-06 NOTE — ED Provider Notes (Signed)
CSN: [id number]     Arrival date & time 02/06/16  1004 History   First MD Initiated Contact with Patient 02/06/16 1031     Chief Complaint  Patient presents with  . Morning Sickness     (Consider location/radiation/quality/duration/timing/severity/associated sxs/prior Treatment) HPI   Kathryn Warren is a 25 y.o. female, with a history of hypertension, presenting to the ED with nausea and vomiting intermittently for the last 4-5 weeks. Patient states she is usually nauseous upon wakening and right before bed. States she will intermittently vomit. Can sometimes keep food and fluids down. Has not been able to hold anything down for the last 24 hours. Has been seen by her OB/GYN, Arther AbbottHenry Dorn, for this issue and prescribed promethazine. Patient states she vomits despite this medication. [id number], one elective and one spontaneous. Patient denies abdominal pain, fever/chills, abnormal vaginal discharge or bleeding, urinary symptoms, or any other complaints.     Past Medical History  Diagnosis Date  . Seasonal allergies   . Hypertension    Past Surgical History  Procedure Laterality Date  . Dilation and curettage of uterus     No family history on file. Social History  Substance Use Topics  . Smoking status: Former Smoker -- 1.00 packs/day    Types: Cigarettes  . Smokeless tobacco: None  . Alcohol Use: No     Comment: rarely   OB History    Gravida Para Term Preterm AB TAB SAB Ectopic Multiple Living   2              Review of Systems  Constitutional: Negative for fever and chills.  Respiratory: Negative for shortness of breath.   Cardiovascular: Negative for chest pain.  Gastrointestinal: Positive for nausea and vomiting. Negative for abdominal pain and diarrhea.  All other systems reviewed and are negative.     Allergies  Review of patient's allergies indicates no known allergies.  Home Medications   Prior to Admission medications   Medication Sig Start Date End Date  Taking? Authorizing Provider  metoCLOPramide (REGLAN) 10 MG tablet Take 1 tablet (10 mg total) by mouth every 6 (six) hours. 02/06/16   Shawn C Joy, PA-C  MICONAZOLE NITRATE VAGINAL 4 % CREA Place 1 Applicatorful vaginally at bedtime. 01/15/16   Tilden FossaElizabeth Rees, MD  nitrofurantoin, macrocrystal-monohydrate, (MACROBID) 100 MG capsule Take 1 capsule (100 mg total) by mouth 2 (two) times daily. 02/06/16   Shawn C Joy, PA-C  ondansetron (ZOFRAN ODT) 4 MG disintegrating tablet Take 1 tablet (4 mg total) by mouth every 8 (eight) hours as needed for nausea or vomiting. 02/06/16   Anselm PancoastShawn C Joy, PA-C  Prenatal Vit-Fe Fumarate-FA (PRENATAL COMPLETE) 14-0.4 MG TABS Take 1 tablet by mouth daily. 01/05/16   Raeford RazorStephen Kohut, MD   BP 111/78 mmHg  Pulse 118  Temp(Src) 98.6 F (37 C) (Oral)  Resp 20  Ht 6\' 1"  (1.854 m)  Wt 97.07 kg  BMI 28.24 kg/m2  SpO2 100%  LMP 12/05/2015 Physical Exam  Constitutional: She appears well-developed and well-nourished. No distress.  HENT:  Head: Normocephalic and atraumatic.  Mouth/Throat: Oropharynx is clear and moist.  Eyes: Conjunctivae are normal.  Neck: Neck supple.  Cardiovascular: Normal rate, regular rhythm, normal heart sounds and intact distal pulses.   Pulmonary/Chest: Effort normal and breath sounds normal. No respiratory distress.  Abdominal: Soft. There is no tenderness. There is no guarding.  Musculoskeletal: She exhibits no edema or tenderness.  Lymphadenopathy:    She has no cervical adenopathy.  Neurological:  She is alert.  Skin: Skin is warm and dry. She is not diaphoretic.  Psychiatric: She has a normal mood and affect. Her behavior is normal.  Nursing note and vitals reviewed.   ED Course  Procedures (including critical care time) Labs Review Labs Reviewed  URINALYSIS, ROUTINE W REFLEX MICROSCOPIC (NOT [facility]) - Abnormal; Notable for the following:    Color, Urine ORANGE (*)    APPearance TURBID (*)    Specific Gravity, Urine 1.041 (*)    Hgb urine  dipstick MODERATE (*)    Bilirubin Urine MODERATE (*)    Ketones, ur >80 (*)    Protein, ur 100 (*)    Leukocytes, UA MODERATE (*)    All other components within normal limits  URINE MICROSCOPIC-ADD ON - Abnormal; Notable for the following:    Squamous Epithelial / LPF TOO NUMEROUS TO COUNT (*)    Bacteria, UA MANY (*)    All other components within normal limits  URINE CULTURE    Imaging Review No results found. I have personally reviewed and evaluated these lab results as part of my medical decision-making.   EKG Interpretation None      MDM   Final diagnoses:  Nausea and vomiting during pregnancy  Acute cystitis without hematuria    Kathryn Warren presents with nausea and vomiting intermittently for the last 4-5 weeks.  Improved after 2 L of IV fluids. Vital signs stabilized to a normal level. Fluid challenge successful. UTI on urinalysis. Patient is nontoxic appearing, afebrile, not tachycardic, not tachypneic, not hypotensive, maintains SPO2 of 98-100% on room air, and is in no apparent distress. Patient has no signs of sepsis or other serious or life-threatening condition. Patient advised to follow-up with OB/GYN as soon as possible. Given Reglan and Zofran Rx. Instructed to use the Reglan first and to only use the Zofran should the Reglan fail. Patient was instructed that these medications are to facilitate proper hydration. The patient was given instructions for further home care as well as return precautions. Patient voices understanding of these instructions, accepts the plan, and is comfortable with discharge.  Filed Vitals:   02/06/16 1007 02/06/16 1106 02/06/16 1206  BP: 111/78  116/70  Pulse: 118 105 88  Temp: 98.6 F (37 C)  98.3 F (36.8 C)  TempSrc: Oral  Oral  Resp: 20 18 15   Height: 6\' 1"  (1.854 m)    Weight: 97.07 kg    SpO2: 100% 100% 98%     Anselm PancoastShawn C Joy, PA-C 02/07/16 0906  Geoffery Lyonsouglas Delo, MD 02/07/16 1043

## 2016-02-07 LAB — URINE CULTURE
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
## 2016-10-11 ENCOUNTER — Encounter (HOSPITAL_BASED_OUTPATIENT_CLINIC_OR_DEPARTMENT_OTHER): Payer: Self-pay | Admitting: Emergency Medicine

## 2016-10-11 ENCOUNTER — Emergency Department (HOSPITAL_BASED_OUTPATIENT_CLINIC_OR_DEPARTMENT_OTHER): Payer: Medicaid Other

## 2016-10-11 ENCOUNTER — Emergency Department (HOSPITAL_BASED_OUTPATIENT_CLINIC_OR_DEPARTMENT_OTHER)
Admission: EM | Admit: 2016-10-11 | Discharge: 2016-10-11 | Disposition: A | Discharge status: Home / Self Care | Payer: Medicaid Other | Attending: Emergency Medicine | Admitting: Emergency Medicine

## 2016-10-11 DIAGNOSIS — I10 Essential (primary) hypertension: Secondary | ICD-10-CM | POA: Insufficient documentation

## 2016-10-11 DIAGNOSIS — R002 Palpitations: Secondary | ICD-10-CM | POA: Insufficient documentation

## 2016-10-11 DIAGNOSIS — Z87891 Personal history of nicotine dependence: Secondary | ICD-10-CM | POA: Insufficient documentation

## 2016-10-11 DIAGNOSIS — Z79899 Other long term (current) drug therapy: Secondary | ICD-10-CM | POA: Insufficient documentation

## 2016-10-11 LAB — BASIC METABOLIC PANEL
Anion gap: 6 (ref 5–15)
BUN: 10 mg/dL (ref 6–20)
CALCIUM: 9 mg/dL (ref 8.9–10.3)
CHLORIDE: 104 mmol/L (ref 101–111)
CO2: 25 mmol/L (ref 22–32)
CREATININE: 0.79 mg/dL (ref 0.44–1.00)
GFR calc Af Amer: >60 mL/min (ref >60)
GFR calc non Af Amer: >60 mL/min (ref >60)
GLUCOSE: 134 mg/dL — AB (ref 65–99)
Potassium: 4.1 mmol/L (ref 3.5–5.1)
Sodium: 135 mmol/L (ref 135–145)

## 2016-10-11 LAB — CBC WITH DIFFERENTIAL/PLATELET
BASOS PCT: 1 %
Basophils Absolute: 0 10*3/uL (ref 0.0–0.1)
Eosinophils Absolute: 0.1 10*3/uL (ref 0.0–0.7)
Eosinophils Relative: 3 %
HEMATOCRIT: 33.8 % — AB (ref 36.0–46.0)
Hemoglobin: 10.2 g/dL — ABNORMAL LOW (ref 12.0–15.0)
LYMPHS ABS: 2.3 10*3/uL (ref 0.7–4.0)
LYMPHS PCT: 49 %
MCH: 22.7 pg — AB (ref 26.0–34.0)
MCHC: 30.2 g/dL (ref 30.0–36.0)
MCV: 75.3 fL — ABNORMAL LOW (ref 78.0–100.0)
MONO ABS: 0.3 10*3/uL (ref 0.1–1.0)
MONOS PCT: 7 %
NEUTROS ABS: 1.9 10*3/uL (ref 1.7–7.7)
Neutrophils Relative %: 40 %
Platelets: 348 10*3/uL (ref 150–400)
RBC: 4.49 MIL/uL (ref 3.87–5.11)
RDW: 18.1 % — AB (ref 11.5–15.5)
WBC: 4.6 10*3/uL (ref 4.0–10.5)

## 2016-10-11 LAB — PREGNANCY, URINE: Preg Test, Ur: NEGATIVE

## 2016-10-11 NOTE — ED Provider Notes (Signed)
[facility]   CSN: [id number] Arrival date & time: 10/11/16  1191     History   Chief Complaint Chief Complaint  Patient presents with  . Palpitations    HPI Kathryn Warren is a 26 y.o. female.  The history is provided by the patient.  Palpitations   This is a new problem. The current episode started yesterday. The problem occurs rarely (3 episodes). The problem has not changed since onset.The problem is associated with an unknown factor. Associated symptoms include irregular heartbeat and shortness of breath. Pertinent negatives include no diaphoresis, no numbness, no chest pain, no chest pressure, no exertional chest pressure, no near-syncope, no orthopnea, no PND, no syncope, no abdominal pain, no nausea, no vomiting, no back pain, no leg pain, no lower extremity edema, no dizziness, no weakness and no cough. She has tried nothing for the symptoms. There are no known risk factors. Her past medical history does not include anemia, heart disease, hyperthyroidism or valve disorder.    Presents with 3 episodes of the sensation that her heart stops beating since yesterday. States that these episodes happened while in the car at rest, lasts for a second that then she feels her heart rate picks up again. States that she feels like something catches, and feels slightly short of breath for a second while this is happening, and then feeling resolves. Symptoms are not associated with exertion or activity. No caffeine use, energy drinks, diet pills. Does drink full sugar soda during the day, but not changed from her normal usage.   Past Medical History:  Diagnosis Date  . Hypertension   . Seasonal allergies     There are no active problems to display for this patient.   Past Surgical History:  Procedure Laterality Date  . CESAREAN SECTION    . DILATION AND CURETTAGE OF UTERUS      OB History    Gravida Para Term Preterm AB Living   2             SAB TAB  Ectopic Multiple Live Births                   Home Medications    Prior to Admission medications   Medication Sig Start Date End Date Taking? Authorizing Provider  metoCLOPramide (REGLAN) 10 MG tablet Take 1 tablet (10 mg total) by mouth every 6 (six) hours. 02/06/16   Shawn C Joy, PA-C  MICONAZOLE NITRATE VAGINAL 4 % CREA Place 1 Applicatorful vaginally at bedtime. 01/15/16   Tilden Fossa, MD  nitrofurantoin, macrocrystal-monohydrate, (MACROBID) 100 MG capsule Take 1 capsule (100 mg total) by mouth 2 (two) times daily. 02/06/16   Shawn C Joy, PA-C  ondansetron (ZOFRAN ODT) 4 MG disintegrating tablet Take 1 tablet (4 mg total) by mouth every 8 (eight) hours as needed for nausea or vomiting. 02/06/16   Anselm Pancoast, PA-C  Prenatal Vit-Fe Fumarate-FA (PRENATAL COMPLETE) 14-0.4 MG TABS Take 1 tablet by mouth daily. 01/05/16   Raeford Razor, MD    Family History No family history on file.  Social History Social History  Substance Use Topics  . Smoking status: Former Smoker    Packs/day: 1.00    Types: Cigarettes  . Smokeless tobacco: Never Used  . Alcohol use No     Comment: rarely     Allergies   Patient has no known allergies.   Review of Systems Review of Systems  Constitutional: Negative for diaphoresis.  HENT: Negative  for congestion.   Respiratory: Positive for shortness of breath. Negative for cough.   Cardiovascular: Positive for palpitations. Negative for chest pain, orthopnea, syncope, PND and near-syncope.  Gastrointestinal: Negative for abdominal pain, nausea and vomiting.  Genitourinary: Negative for difficulty urinating.  Musculoskeletal: Negative for back pain.  Allergic/Immunologic: Negative for immunocompromised state.  Neurological: Negative for dizziness, weakness and numbness.  Hematological: Does not bruise/bleed easily.  Psychiatric/Behavioral: Negative for confusion.     Physical Exam Updated Vital Signs BP 146/89 (BP Location: Left Arm)   Pulse 76    Temp 98.1 F (36.7 C) (Oral)   Resp 18   Ht 6\' 1"  (1.854 m)   LMP 09/20/2016   SpO2 100%   Breastfeeding? Unknown   Physical Exam Physical Exam  Nursing note and vitals reviewed. Constitutional: Well developed, well nourished, non-toxic, and in no acute distress Head: Normocephalic and atraumatic.  Mouth/Throat: Oropharynx is clear and moist.  Neck: Normal range of motion. Neck supple.  Cardiovascular: Normal rate and regular rhythm.   Pulmonary/Chest: Effort normal and breath sounds normal.  Abdominal: Soft. There is no tenderness. There is no rebound and no guarding.  Musculoskeletal: Normal range of motion. no calf tenderness or leg swelling Neurological: Alert, no facial droop, fluent speech, moves all extremities symmetrically Skin: Skin is warm and dry.  Psychiatric: Cooperative   ED Treatments / Results  Labs (all labs ordered are listed, but only abnormal results are displayed) Labs Reviewed  CBC WITH DIFFERENTIAL/PLATELET - Abnormal; Notable for the following:       Result Value   Hemoglobin 10.2 (*)    HCT 33.8 (*)    MCV 75.3 (*)    MCH 22.7 (*)    RDW 18.1 (*)    All other components within normal limits  BASIC METABOLIC PANEL - Abnormal; Notable for the following:    Glucose, Bld 134 (*)    All other components within normal limits  PREGNANCY, URINE    EKG  EKG Interpretation  Date/Time:  Thursday October 11 2016 09:38:04 EST Ventricular Rate:  82 PR Interval:    QRS Duration: 95 QT Interval:  384 QTC Calculation: 449 R Axis:   72 Text Interpretation:  Sinus rhythm no acute abnormalities, similar to prior EKG  Confirmed by Dannell Gortney MD, Raye Slyter [phone]([id number]) on 10/11/2016 9:48:23 AM       Radiology Dg Chest 2 View  Result Date: 10/11/2016 CLINICAL DATA:  Some shortness of breath over the last 2 days, palpitations EXAM: CHEST  2 VIEW COMPARISON:  None. FINDINGS: No active infiltrate or effusion is seen. Mediastinal and hilar contours are unremarkable. The heart  is within normal limits in size. No bony abnormality is seen. IMPRESSION: No active cardiopulmonary disease. Electronically Signed   By: Dwyane DeePaul  Barry M.D.   On: 10/11/2016 09:59    Procedures Procedures (including critical care time)  Medications Ordered in ED Medications - No data to display   Initial Impression / Assessment and Plan / ED Course  I have reviewed the triage vital signs and the nursing notes.  Pertinent labs & imaging results that were available during my care of the patient were reviewed by me and considered in my medical decision making (see chart for details).     Presenting with palpitations, and sensation that her heart is skipping beats. It is well-appearing in no acute distress with normal vital signs. EKG visualized and shows normal sinus rhythm without stigmata of arrhythmia. Suspect that she may be feeling potential PVCs or  sinus arrhythmia. Low suspicion for serious arrhythmias or other serious cardiopulmonary processes. Currently asymptomatic. PERC negative and history not concerning really for PE. No major electrolyte or metabolic derangements on blood work. She has baseline anemia, and unlikely to be causing her these symptoms. Felt to be stable for discharge home.   The patient appears reasonably screened and/or stabilized for discharge and I doubt any other medical condition or other [facility] requiring further screening, evaluation, or treatment in the ED at this time prior to discharge.  Strict return and follow-up instructions reviewed. She expressed understanding of all discharge instructions and felt comfortable with the plan of care.     Final Clinical Impressions(s) / ED Diagnoses   Final diagnoses:  Palpitations    New Prescriptions New Prescriptions   No medications on file     Lavera Guise, MD 10/11/16 1050

## 2016-10-11 NOTE — ED Triage Notes (Signed)
Pt reports feeling like her heart "stops" for a second and she gasps for air. This happened a few times yesterday and again this morning. Denies pain. Reports mild SOB when this happens.

## 2016-10-11 NOTE — Discharge Instructions (Signed)
Your blood work and heart work up are reassuring.  Return for worsening symptoms, including passing out, difficulty breathing, severe chest pain or any other symptoms concerning to you.

## 2016-11-28 ENCOUNTER — Encounter (HOSPITAL_BASED_OUTPATIENT_CLINIC_OR_DEPARTMENT_OTHER): Payer: Self-pay | Admitting: Emergency Medicine

## 2016-11-28 ENCOUNTER — Emergency Department (HOSPITAL_BASED_OUTPATIENT_CLINIC_OR_DEPARTMENT_OTHER)
Admission: EM | Admit: 2016-11-28 | Discharge: 2016-11-28 | Disposition: A | Discharge status: Home / Self Care | Payer: Medicaid Other | Attending: Emergency Medicine | Admitting: Emergency Medicine

## 2016-11-28 DIAGNOSIS — Z87891 Personal history of nicotine dependence: Secondary | ICD-10-CM | POA: Insufficient documentation

## 2016-11-28 DIAGNOSIS — M62830 Muscle spasm of back: Secondary | ICD-10-CM | POA: Insufficient documentation

## 2016-11-28 DIAGNOSIS — I10 Essential (primary) hypertension: Secondary | ICD-10-CM | POA: Diagnosis not present

## 2016-11-28 DIAGNOSIS — Z79899 Other long term (current) drug therapy: Secondary | ICD-10-CM | POA: Insufficient documentation

## 2016-11-28 DIAGNOSIS — M545 Low back pain: Secondary | ICD-10-CM | POA: Diagnosis present

## 2016-11-28 MED ORDER — IBUPROFEN 800 MG PO TABS: 800.0000 mg | ORAL_TABLET | Freq: Three times a day (TID) | ORAL | 0 refills | Status: DC | PRN

## 2016-11-28 NOTE — Discharge Instructions (Signed)
You have been seen in the Emergency Department (ED)  today for back pain.  Your workup and exam have not shown any acute abnormalities and you are likely suffering from muscle strain or possible problems with your discs, but there is no treatment that will fix your symptoms at this time.  Please take Motrin (ibuprofen) as needed for your pain according to the instructions written on the box.  Alternatively, for the next five days you can take 800mg three times daily with meals (it may upset your stomach).   Please follow up with your doctor as soon as possible regarding today's ED visit and your back pain.  Return to the ED for worsening back pain, fever, weakness or numbness of either leg, or if you develop either (1) an inability to urinate or have bowel movements, or (2) loss of your ability to control your bathroom functions (if you start having "accidents"), or if you develop other new symptoms that concern you.  

## 2016-11-28 NOTE — ED Triage Notes (Signed)
Bil knee pain x 1 week, denies recent injury and mid to lower back pain since yesterday. Denies urinary s/s . Has not taken any OTC meds for pain

## 2016-11-28 NOTE — ED Provider Notes (Signed)
Emergency Department Provider Note   I have reviewed the triage vital signs and the nursing notes.   HISTORY  Chief Complaint Back Pain   HPI Kathryn Warren is a 26 y.o. female with PMH of HTN and seasonal allergies presents to the emergency department for evaluation of intermittent bilateral knee pain and lower back pain. 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 She states that over the last week when she stands up after long periods of working she has bilateral throbbing pain in both knees that resolves with ambulation. No radiation of pain. No swelling, redness, warmth of the knees. Starting yesterday she also noticed some associated back tightness. She describes bilateral primarily lower back pain that is worse with twisting or movement. She also notices this is worse when she stands and seems to resolve with movement. She denies any urinary frequency, hesitancy, dysuria. No vaginal bleeding or discharge. Denies any fever or chills. No abdominal discomfort. No weakness or numbness in the legs. She has not taken any OTC medications or used a heating pad.    Past Medical History:  Diagnosis Date  . Hypertension   . Seasonal allergies     There are no active problems to display for this patient.   Past Surgical History:  Procedure Laterality Date  . CESAREAN SECTION    . DILATION AND CURETTAGE OF UTERUS      Current Outpatient Rx  . Order #: [id number] Class: Print  . Order #: [id number] Class: Print  . Order #: [id number] Class: Print  . Order #: [id number] Class: Print  . Order #: [id number] Class: Print  . Order #: [id number] Class: Print    Allergies Patient has no known allergies.  No family history on file.  Social History Social History  Substance Use Topics  . Smoking status: Former Smoker    Packs/day: 1.00    Types: Cigarettes  . Smokeless tobacco: Never Used  . Alcohol use No     Comment: rarely    Review of  Systems  Constitutional: No fever/chills Eyes: No visual changes. ENT: No sore throat. Cardiovascular: Denies chest pain. Respiratory: Denies shortness of breath. Gastrointestinal: No abdominal pain.  No nausea, no vomiting.  No diarrhea.  No constipation. Genitourinary: Negative for dysuria. Musculoskeletal: Positive for back pain and intermittent B/L knee pain.  Skin: Negative for rash. Neurological: Negative for headaches, focal weakness or numbness.  10-point ROS otherwise negative.  ____________________________________________   PHYSICAL EXAM:  VITAL SIGNS: ED Triage Vitals  Enc Vitals Group     BP 11/28/16 0729 (!) 144/92     Pulse Rate 11/28/16 0729 85     Resp 11/28/16 0729 18     Temp 11/28/16 0729 97.9 F (36.6 C)     Temp Source 11/28/16 0729 Oral     SpO2 11/28/16 0729 99 %     Weight 11/28/16 0729 243 lb (110.2 kg)     Height 11/28/16 0729  (1.854 m)     Pain Score 11/28/16 0737 8   Constitutional: Alert and oriented. Well appearing and in no acute distress. Eyes: Conjunctivae are normal.  Head: Atraumatic. Nose: No congestion/rhinnorhea. Mouth/Throat: Mucous membranes are moist.  Oropharynx non-erythematous. Neck: No stridor.  Cardiovascular: Normal rate, regular rhythm. Good peripheral circulation. Grossly normal heart sounds.   Respiratory: Normal respiratory effort.  No retractions. Lungs CTAB. Gastrointestinal: Soft and nontender. No distention.  Musculoskeletal: No lower extremity tenderness nor edema. No gross deformities of extremities. No midline  thoracic or lumbar spine tenderness to palpation. Mild paraspinal tenderness to palpation.  Neurologic:  Normal speech and language. No gross focal neurologic deficits are appreciated.  Skin:  Skin is warm, dry and intact. No rash noted.  ____________________________________________   PROCEDURES  Procedure(s) performed:    Procedures  None ____________________________________________   INITIAL IMPRESSION / ASSESSMENT AND PLAN / ED COURSE  Pertinent labs & imaging results that were available during my care of the patient were reviewed by me and considered in my medical decision making (see chart for details).  Patient resents to the emergency department for evaluation of lower back pain and bilateral knee pain. Symptoms seem to be associated with long periods of inactivity and relieved with movement. Symptoms made immediately worse with twisting from side to side. No abdominal tenderness. No dysuria, hesitancy, urgency. No increase suspicion for vascular etiology of pain. No symptoms to suggest UTI, pyelonephritis, or PID. Plan for NSAIDs, heat, and PCP follow up.   At this time, I do not feel there is any life-threatening condition present. I have reviewed and discussed all results (EKG, imaging, lab, urine as appropriate), exam findings with patient. I have reviewed nursing notes and appropriate previous records.  I feel the patient is safe to be discharged home without further emergent workup. Discussed usual and customary return precautions. Patient and family (if present) verbalize understanding and are comfortable with this plan.  Patient will follow-up with their primary care provider. If they do not have a primary care provider, information for follow-up has been provided to them. All questions have been answered.    ____________________________________________  FINAL CLINICAL IMPRESSION(S) / ED DIAGNOSES  Final diagnoses:  Muscle spasm of back     MEDICATIONS GIVEN DURING THIS VISIT:  None  NEW OUTPATIENT MEDICATIONS STARTED DURING THIS VISIT:  New Prescriptions   IBUPROFEN (ADVIL,MOTRIN) 800 MG TABLET    Take 1 tablet (800 mg total) by mouth every 8 (eight) hours as needed.     Note:  This document was prepared using Dragon voice recognition software and may include unintentional  dictation errors.  Alona Bene, MD Emergency Medicine   Maia Plan, MD 11/28/16 [phone]

## 2016-11-29 ENCOUNTER — Encounter (HOSPITAL_BASED_OUTPATIENT_CLINIC_OR_DEPARTMENT_OTHER): Payer: Self-pay | Admitting: *Deleted

## 2016-11-29 ENCOUNTER — Emergency Department (HOSPITAL_BASED_OUTPATIENT_CLINIC_OR_DEPARTMENT_OTHER)
Admission: EM | Admit: 2016-11-29 | Discharge: 2016-11-29 | Disposition: A | Discharge status: Home / Self Care | Payer: Medicaid Other | Attending: Emergency Medicine | Admitting: Emergency Medicine

## 2016-11-29 DIAGNOSIS — I10 Essential (primary) hypertension: Secondary | ICD-10-CM | POA: Diagnosis not present

## 2016-11-29 DIAGNOSIS — M545 Low back pain, unspecified: Secondary | ICD-10-CM

## 2016-11-29 DIAGNOSIS — Z79899 Other long term (current) drug therapy: Secondary | ICD-10-CM | POA: Diagnosis not present

## 2016-11-29 DIAGNOSIS — Z87891 Personal history of nicotine dependence: Secondary | ICD-10-CM | POA: Diagnosis not present

## 2016-11-29 MED ORDER — CYCLOBENZAPRINE HCL 10 MG PO TABS: 10.0000 mg | ORAL_TABLET | Freq: Two times a day (BID) | ORAL | 0 refills | Status: DC | PRN

## 2016-11-29 MED ORDER — NAPROXEN 500 MG PO TABS: 500.0000 mg | ORAL_TABLET | Freq: Two times a day (BID) | ORAL | 0 refills | Status: DC

## 2016-11-29 MED ORDER — HYDROCODONE-ACETAMINOPHEN 5-325 MG PO TABS: 1.0000 | ORAL_TABLET | Freq: Four times a day (QID) | ORAL | 0 refills | Status: DC | PRN

## 2016-11-29 MED FILL — CYCLOBENZAPRINE 10 MG TAB: 10 | 10 days supply | Qty: 20 | Fill #0

## 2016-11-29 MED FILL — HYDROCODON-APAP 5-325: 5-325 | 2 days supply | Qty: 10 | Fill #0

## 2016-11-29 MED FILL — NAPROXEN 500 MG TABLET: 500 | 7 days supply | Qty: 14 | Fill #0

## 2016-11-29 NOTE — ED Notes (Signed)
ED Provider at bedside. 

## 2016-11-29 NOTE — ED Notes (Signed)
Pt verbalized understanding of discharge instructions and denies any further questions at this time.   Medications and indications discussed with patient.

## 2016-11-29 NOTE — ED Provider Notes (Signed)
[facility]   CSN: [id number] Arrival date & time: 11/29/16  1130     History   Chief Complaint Chief Complaint  Patient presents with  . Back Pain    HPI Kathryn Warren is a 26 y.o. female.  Patient with complaint of a persistent low right-sided back pain without any numbness or radiation into the leg. The pain is 10 out of 10 was seen yesterday treated with Motrin patient states Motrin not helping. Patient's had some pain in that area about a month ago. But it resolved. No history of injury. Not hurting to P no fevers no abdominal pain no nausea or vomiting. No history of kidney stones.      Past Medical History:  Diagnosis Date  . Hypertension   . Seasonal allergies     There are no active problems to display for this patient.   Past Surgical History:  Procedure Laterality Date  . CESAREAN SECTION    . DILATION AND CURETTAGE OF UTERUS      OB History    Gravida Para Term Preterm AB Living   2             SAB TAB Ectopic Multiple Live Births                   Home Medications    Prior to Admission medications   Medication Sig Start Date End Date Taking? Authorizing Provider  cyclobenzaprine (FLEXERIL) 10 MG tablet Take 1 tablet (10 mg total) by mouth 2 (two) times daily as needed for muscle spasms. 11/29/16   Vanetta Mulders, MD  HYDROcodone-acetaminophen (NORCO/VICODIN) 5-325 MG tablet Take 1-2 tablets by mouth every 6 (six) hours as needed for moderate pain. 11/29/16   Vanetta Mulders, MD  ibuprofen (ADVIL,MOTRIN) 800 MG tablet Take 1 tablet (800 mg total) by mouth every 8 (eight) hours as needed. 11/28/16   Maia Plan, MD  metoCLOPramide (REGLAN) 10 MG tablet Take 1 tablet (10 mg total) by mouth every 6 (six) hours. 02/06/16   Shawn C Joy, PA-C  MICONAZOLE NITRATE VAGINAL 4 % CREA Place 1 Applicatorful vaginally at bedtime. 01/15/16   Tilden Fossa, MD  naproxen (NAPROSYN) 500 MG tablet Take 1 tablet (500 mg total) by mouth 2  (two) times daily. 11/29/16   Vanetta Mulders, MD  nitrofurantoin, macrocrystal-monohydrate, (MACROBID) 100 MG capsule Take 1 capsule (100 mg total) by mouth 2 (two) times daily. 02/06/16   Shawn C Joy, PA-C  ondansetron (ZOFRAN ODT) 4 MG disintegrating tablet Take 1 tablet (4 mg total) by mouth every 8 (eight) hours as needed for nausea or vomiting. 02/06/16   Anselm Pancoast, PA-C  Prenatal Vit-Fe Fumarate-FA (PRENATAL COMPLETE) 14-0.4 MG TABS Take 1 tablet by mouth daily. 01/05/16   Raeford Razor, MD    Family History No family history on file.  Social History Social History  Substance Use Topics  . Smoking status: Former Smoker    Packs/day: 1.00    Types: Cigarettes  . Smokeless tobacco: Never Used  . Alcohol use Yes     Comment: weekly     Allergies   Patient has no known allergies.   Review of Systems Review of Systems  Constitutional: Negative for fever.  Eyes: Negative for redness.  Respiratory: Negative for shortness of breath.   Cardiovascular: Negative for chest pain.  Gastrointestinal: Negative for abdominal pain.  Genitourinary: Negative for dysuria and hematuria.  Musculoskeletal: Positive for back pain.  Skin: Negative for rash.  Neurological: Negative for weakness and headaches.  Hematological: Does not bruise/bleed easily.  Psychiatric/Behavioral: Negative for confusion.     Physical Exam Updated Vital Signs BP (!) 150/88   Pulse 80   Temp 98.2 F (36.8 C) (Oral)   Resp 18   Ht  (1.854 m)   Wt 112.5 kg   LMP 11/18/2016 (Exact Date)   SpO2 100%   BMI 32.72 kg/m   Physical Exam  Constitutional: She appears well-developed and well-nourished. No distress.  HENT:  Head: Normocephalic.  Mouth/Throat: Oropharynx is clear and moist.  Eyes: EOM are normal. Pupils are equal, round, and reactive to light.  Neck: Neck supple.  Cardiovascular: Normal rate, regular rhythm and normal heart sounds.   Pulmonary/Chest: Effort normal and breath sounds normal.  No respiratory distress.  Abdominal: Soft. Bowel sounds are normal. There is no tenderness.  Musculoskeletal: Normal range of motion. She exhibits no edema.  Neurological: She is alert. No cranial nerve deficit or sensory deficit. She exhibits normal muscle tone. Coordination normal.  Skin: Skin is warm.  Nursing note and vitals reviewed.    ED Treatments / Results  Labs (all labs ordered are listed, but only abnormal results are displayed) Labs Reviewed - No data to display  EKG  EKG Interpretation None       Radiology No results found.  Procedures Procedures (including critical care time)  Medications Ordered in ED Medications - No data to display   Initial Impression / Assessment and Plan / ED Course  I have reviewed the triage vital signs and the nursing notes.  Pertinent labs & imaging results that were available during my care of the patient were reviewed by me and considered in my medical decision making (see chart for details).     Patient returns today for musculoskeletal right-sided low back pain without any sciatica. Patient states Motrin not helping. Symptoms do not seem to be consistent with anything other than a muscular back problem. No history of injury. Not consistent with kidney stone or urinary tract infection.  Patient has had some trouble in the past in this area but not as severe as this time.  Will treat with Naprosyn and hydrocodone Flexeril and school note provided. Also recommend bed rest and referral to sports medicine.  Final Clinical Impressions(s) / ED Diagnoses   Final diagnoses:  Acute right-sided low back pain without sciatica    New Prescriptions New Prescriptions   CYCLOBENZAPRINE (FLEXERIL) 10 MG TABLET    Take 1 tablet (10 mg total) by mouth 2 (two) times daily as needed for muscle spasms.   HYDROCODONE-ACETAMINOPHEN (NORCO/VICODIN) 5-325 MG TABLET    Take 1-2 tablets by mouth every 6 (six) hours as needed for moderate pain.    NAPROXEN (NAPROSYN) 500 MG TABLET    Take 1 tablet (500 mg total) by mouth 2 (two) times daily.     Vanetta Mulders, MD 11/29/16 1346

## 2016-11-29 NOTE — ED Triage Notes (Signed)
Pt reports R lower back pain for approx 1wk. Reports being seen for same yesterday and states pain has gotten worse -- reports no meds taken PTA. Denies known injury, urinary symptoms, numbness/tingling, fever, n/v/d.

## 2016-11-29 NOTE — Discharge Instructions (Signed)
As we discussed rest as much as possible try the combination of Naprosyn on a regular basis for the next 7 days. Hydrocodone as needed for breakthrough pain. And Flexeril. School note provided. Referral provided to follow-up with sports medicine if you do not improve.

## 2016-12-17 ENCOUNTER — Emergency Department (HOSPITAL_BASED_OUTPATIENT_CLINIC_OR_DEPARTMENT_OTHER)
Admission: EM | Admit: 2016-12-17 | Discharge: 2016-12-17 | Disposition: A | Discharge status: Home / Self Care | Payer: Self-pay | Attending: Emergency Medicine | Admitting: Emergency Medicine

## 2016-12-17 ENCOUNTER — Encounter (HOSPITAL_BASED_OUTPATIENT_CLINIC_OR_DEPARTMENT_OTHER): Payer: Self-pay

## 2016-12-17 DIAGNOSIS — M545 Low back pain, unspecified: Secondary | ICD-10-CM

## 2016-12-17 DIAGNOSIS — R319 Hematuria, unspecified: Secondary | ICD-10-CM | POA: Insufficient documentation

## 2016-12-17 DIAGNOSIS — M791 Myalgia: Secondary | ICD-10-CM | POA: Insufficient documentation

## 2016-12-17 DIAGNOSIS — I10 Essential (primary) hypertension: Secondary | ICD-10-CM | POA: Insufficient documentation

## 2016-12-17 DIAGNOSIS — Z87891 Personal history of nicotine dependence: Secondary | ICD-10-CM | POA: Insufficient documentation

## 2016-12-17 DIAGNOSIS — R109 Unspecified abdominal pain: Secondary | ICD-10-CM | POA: Insufficient documentation

## 2016-12-17 LAB — URINALYSIS, MICROSCOPIC (REFLEX)

## 2016-12-17 LAB — URINALYSIS, ROUTINE W REFLEX MICROSCOPIC
BILIRUBIN URINE: NEGATIVE
Glucose, UA: NEGATIVE mg/dL
Ketones, ur: 15 mg/dL — AB
NITRITE: NEGATIVE
Protein, ur: NEGATIVE mg/dL
SPECIFIC GRAVITY, URINE: 1.031 — AB (ref 1.005–1.030)
pH: 6 (ref 5.0–8.0)

## 2016-12-17 LAB — PREGNANCY, URINE: Preg Test, Ur: NEGATIVE

## 2016-12-17 NOTE — Discharge Instructions (Signed)
Follow-up with your primary care for evaluation of your low back pain and referral to physical therapy, as well as blood in your urine. He may also follow up with the urologist I have mentioned above.   Back Pain:  Your back pain should be treated with medicines such as ibuprofen or aleve and this back pain should get better over the next 2 weeks.  However if you develop severe or worsening pain, low back pain with fever, numbness, weakness or inability to walk or urinate, you should return to the ER immediately.  Please follow up with your doctor this week for a recheck if still having symptoms.  Self - care:  The application of heat can help soothe the pain.  Maintaining your daily activities, including walking, is encourged, as it will help you get better faster than just staying in bed.  Medications are also useful to help with pain control.  A commonly prescribed medications includes acetaminophen.  This medication is generally safe, though you should not take more than 8 of the extra strength (500mg ) pills a day.  Non steroidal anti inflammatory medications including Ibuprofen and naproxen;  These medications help both pain and swelling and are very useful in treating back pain.  They should be taken with food, as they can cause stomach upset, and more seriously, stomach bleeding.    Muscle relaxants:  These medications can help with muscle tightness that is a cause of lower back pain.  Most of these medications can cause drowsiness, and it is not safe to drive or use dangerous machinery while taking them.  Be aware that if you develop new symptoms, such as a fever, leg weakness, difficulty with or loss of control of your urine or bowels, abdominal pain, or more severe pain, you will need to seek medical attention and  / or return to the Emergency department.

## 2016-12-17 NOTE — ED Provider Notes (Signed)
[facility]   CSN: [id number] Arrival date & time: 12/17/16  1412     History   Chief Complaint Chief Complaint  Patient presents with  . Abdominal Pain    HPI Kathryn Warren is a 26 y.o. female with history of low back pain and hypertension presents today with chief complaint acute onset, progressively improving right-sided back and flank pain. She states she has back pain intermittently for the past 2-3 weeks and has been evaluate for this here in the past. She states the muscle relaxant she was given has not been helpful. Pain is moderate and achy in nature. Does not radiate, and patient denies any recent trauma or falls as well as any numbness, tingling, weakness.  She states she has had pain like this ever since she gave birth to her son, and her current back pain is consistent with pain she is experiencing the past .   Right flank pain developed 4 days ago, and is currently constant and moderately achy in nature . She states Tylenol has been helpful. She states this "feels like a UTI "and has been drinking a lot of water and cranberry juice which has improved her symptoms.   Denies fever, chills, bowel/bladder incontinence, dysuria, melena, hematuria, vaginal itching, discharge, bleeding. n/v/d  The history is provided by the patient.    Past Medical History:  Diagnosis Date  . Hypertension   . Seasonal allergies     There are no active problems to display for this patient.   Past Surgical History:  Procedure Laterality Date  . CESAREAN SECTION    . DILATION AND CURETTAGE OF UTERUS      OB History    Gravida Para Term Preterm AB Living   2             SAB TAB Ectopic Multiple Live Births                   Home Medications    Prior to Admission medications   Not on File    Family History No family history on file.  Social History Social History  Substance Use Topics  . Smoking status: Former Smoker    Packs/day: 1.00   Types: Cigarettes  . Smokeless tobacco: Never Used  . Alcohol use Yes     Comment: weekly     Allergies   Patient has no known allergies.   Review of Systems Review of Systems  Constitutional: Negative for chills and fever.  Respiratory: Negative for shortness of breath.   Cardiovascular: Negative for chest pain.  Gastrointestinal: Negative for abdominal pain, diarrhea, nausea and vomiting.  Genitourinary: Positive for flank pain. Negative for dysuria and hematuria.  Musculoskeletal: Positive for back pain and myalgias. Negative for neck pain.  Neurological: Negative for syncope and headaches.     Physical Exam Updated Vital Signs BP 128/80 (BP Location: Right Arm)   Pulse 74   Temp 98.7 F (37.1 C) (Oral)   Resp 12   Ht 6\' 1"  (1.854 m)   Wt 110.2 kg   LMP 12/12/2016   SpO2 98%   BMI 32.06 kg/m   Physical Exam  Constitutional: She is oriented to person, place, and time. She appears well-developed and well-nourished. No distress.  HENT:  Head: Normocephalic and atraumatic.  Eyes: Conjunctivae are normal. Right eye exhibits no discharge. Left eye exhibits no discharge.  Neck: Neck supple. No JVD present. No tracheal deviation present.  Cardiovascular: Regular rhythm, normal heart  sounds and intact distal pulses.   No murmur heard. Tachycardic, 2+ radial and DP/PT pulses bl, negative Homan's bl   Pulmonary/Chest: Effort normal and breath sounds normal. No respiratory distress.  Abdominal: Soft. Bowel sounds are normal. She exhibits no distension. There is tenderness.  Mild suprapubic tenderness, negative Murphy's, negative psoas, no tenderness to palpation at McBurney's point  Genitourinary:  Genitourinary Comments: No CVA tenderness  Musculoskeletal: She exhibits tenderness. She exhibits no edema.  No midline spine tenderness to palpation. No paraspinal muscle spasm. Mild right sided flank ttp. No pain with flexion, extension, or lateral motion of LSP. Full ROM of  BUE and BLE and LSP. 5/5 strength BLE with good grip strength.   Neurological: She is alert and oriented to person, place, and time. No cranial nerve deficit or sensory deficit.  Fluent speech, no facial droop, sensation intact globally, normal gait, and patient able to heel walk and toe walk without difficulty.   Skin: Skin is warm and dry. She is not diaphoretic.  Psychiatric: She has a normal mood and affect. Her behavior is normal.  Nursing note and vitals reviewed.    ED Treatments / Results  Labs (all labs ordered are listed, but only abnormal results are displayed) Labs Reviewed  URINALYSIS, ROUTINE W REFLEX MICROSCOPIC - Abnormal; Notable for the following:       Result Value   APPearance CLOUDY (*)    Specific Gravity, Urine 1.031 (*)    Hgb urine dipstick LARGE (*)    Ketones, ur 15 (*)    Leukocytes, UA MODERATE (*)    All other components within normal limits  URINALYSIS, MICROSCOPIC (REFLEX) - Abnormal; Notable for the following:    Bacteria, UA FEW (*)    Squamous Epithelial / LPF 6-30 (*)    All other components within normal limits  URINE CULTURE  PREGNANCY, URINE    EKG  EKG Interpretation None       Radiology No results found.  Procedures Procedures (including critical care time)  Medications Ordered in ED Medications - No data to display   Initial Impression / Assessment and Plan / ED Course  I have reviewed the triage vital signs and the nursing notes.  Pertinent labs & imaging results that were available during my care of the patient were reviewed by me and considered in my medical decision making (see chart for details).     Patient with her typical low back pain and additional to vomit of right-sided flank pain. Afebrile, vital signs are stable. No concern for cauda equina syndrome or trauma. UA not consistent with UTI, sent for culture. She has had multiple UAs with hematuria; discussed with patient and recommend follow-up with primary  care or urology for further evaluation. Low suspicion of nephrolithiasis, pyelonephritis, or other acute abdominal pathology. Pain appears to be musculoskeletal in nature. Discussed follow-up with primary care for referral to physical therapy which I believe will be helpful for her. Discussed use of improvement, Tylenol as well as ice or heat to the affected areas. Discussed strict ED return precautions. Pt verbalized understanding of and agreement with plan and is safe for discharge home at this time.   Final Clinical Impressions(s) / ED Diagnoses   Final diagnoses:  Acute right-sided low back pain without sciatica  Hematuria, unspecified type    New Prescriptions There are no discharge medications for this patient.    Jeanie Sewer, PA-C 12/17/16 1705    Geoffery Lyons, MD 12/24/16 [phone]

## 2016-12-17 NOTE — ED Triage Notes (Signed)
c/o right side abd and back pain x 1 week-states she was recently seen for back pain-NAD-steady gait

## 2016-12-18 LAB — URINE CULTURE

## 2017-03-30 ENCOUNTER — Emergency Department (HOSPITAL_BASED_OUTPATIENT_CLINIC_OR_DEPARTMENT_OTHER)
Admission: EM | Admit: 2017-03-30 | Discharge: 2017-03-30 | Disposition: A | Discharge status: Home / Self Care | Payer: Medicaid Other | Attending: Emergency Medicine | Admitting: Emergency Medicine

## 2017-03-30 ENCOUNTER — Encounter (HOSPITAL_BASED_OUTPATIENT_CLINIC_OR_DEPARTMENT_OTHER): Payer: Self-pay | Admitting: Emergency Medicine

## 2017-03-30 DIAGNOSIS — M545 Low back pain: Secondary | ICD-10-CM | POA: Insufficient documentation

## 2017-03-30 DIAGNOSIS — R110 Nausea: Secondary | ICD-10-CM | POA: Diagnosis not present

## 2017-03-30 DIAGNOSIS — Z5321 Procedure and treatment not carried out due to patient leaving prior to being seen by health care provider: Secondary | ICD-10-CM | POA: Insufficient documentation

## 2017-03-30 DIAGNOSIS — N390 Urinary tract infection, site not specified: Secondary | ICD-10-CM

## 2017-03-30 LAB — CBC WITH DIFFERENTIAL/PLATELET
Basophils Absolute: 0 10*3/uL (ref 0.0–0.1)
Basophils Relative: 1 %
Eosinophils Absolute: 0.1 10*3/uL (ref 0.0–0.7)
Eosinophils Relative: 2 %
HCT: 32.9 % — ABNORMAL LOW (ref 36.0–46.0)
Hemoglobin: 10.5 g/dL — ABNORMAL LOW (ref 12.0–15.0)
Lymphocytes Relative: 49 %
Lymphs Abs: 2.7 10*3/uL (ref 0.7–4.0)
MCH: 25.3 pg — ABNORMAL LOW (ref 26.0–34.0)
MCHC: 31.9 g/dL (ref 30.0–36.0)
MCV: 79.3 fL (ref 78.0–100.0)
Monocytes Absolute: 0.4 10*3/uL (ref 0.1–1.0)
Monocytes Relative: 7 %
Neutro Abs: 2.2 10*3/uL (ref 1.7–7.7)
Neutrophils Relative %: 41 %
Platelets: 327 10*3/uL (ref 150–400)
RBC: 4.15 MIL/uL (ref 3.87–5.11)
RDW: 15.6 % — ABNORMAL HIGH (ref 11.5–15.5)
WBC: 5.4 10*3/uL (ref 4.0–10.5)

## 2017-03-30 LAB — COMPREHENSIVE METABOLIC PANEL
ALT: 15 U/L (ref 14–54)
AST: 16 U/L (ref 15–41)
Albumin: 3.8 g/dL (ref 3.5–5.0)
Alkaline Phosphatase: 66 U/L (ref 38–126)
Anion gap: 7 (ref 5–15)
BUN: 15 mg/dL (ref 6–20)
CO2: 24 mmol/L (ref 22–32)
Calcium: 8.9 mg/dL (ref 8.9–10.3)
Chloride: 104 mmol/L (ref 101–111)
Creatinine, Ser: 0.83 mg/dL (ref 0.44–1.00)
GFR calc Af Amer: >60 mL/min (ref >60)
GFR calc non Af Amer: >60 mL/min (ref >60)
Glucose, Bld: 147 mg/dL — ABNORMAL HIGH (ref 65–99)
Potassium: 4 mmol/L (ref 3.5–5.1)
Sodium: 135 mmol/L (ref 135–145)
Total Bilirubin: 0.3 mg/dL (ref 0.3–1.2)
Total Protein: 7.2 g/dL (ref 6.5–8.1)

## 2017-03-30 LAB — URINALYSIS, MICROSCOPIC (REFLEX)

## 2017-03-30 LAB — URINALYSIS, ROUTINE W REFLEX MICROSCOPIC
BILIRUBIN URINE: NEGATIVE
Glucose, UA: NEGATIVE mg/dL
Ketones, ur: NEGATIVE mg/dL
NITRITE: NEGATIVE
PROTEIN: NEGATIVE mg/dL
Specific Gravity, Urine: 1.025 (ref 1.005–1.030)
pH: 5.5 (ref 5.0–8.0)

## 2017-03-30 LAB — PREGNANCY, URINE: PREG TEST UR: NEGATIVE

## 2017-03-30 MED ORDER — SODIUM CHLORIDE 0.9 % IV BOLUS (SEPSIS)
1000.0000 mL | Freq: Once | INTRAVENOUS | Status: AC
  Administered 2017-03-30: 1000 mL via INTRAVENOUS

## 2017-03-30 NOTE — ED Notes (Signed)
Pt left d/t length of wait to complete treatment; called pt to inquire about IV, as it was not located in room; pt sts she put it in the sharps container.

## 2017-03-30 NOTE — ED Triage Notes (Signed)
"   My breast have been hurting me and it makes my back hurt and I have been sick with nausea for the last 2 weeks" just finished menstrual cycle.

## 2017-03-30 NOTE — ED Notes (Signed)
Pelvic exam has not been performed; EDP sts student is going to do exam.

## 2017-03-31 LAB — URINE CULTURE: Culture: NO GROWTH

## 2017-04-17 ENCOUNTER — Encounter: Payer: Self-pay | Admitting: Plastic Surgery

## 2017-12-17 ENCOUNTER — Other Ambulatory Visit: Payer: Self-pay

## 2017-12-17 ENCOUNTER — Emergency Department (HOSPITAL_BASED_OUTPATIENT_CLINIC_OR_DEPARTMENT_OTHER)
Admission: EM | Admit: 2017-12-17 | Discharge: 2017-12-17 | Disposition: A | Discharge status: Home / Self Care | Payer: Medicaid Other | Attending: Emergency Medicine | Admitting: Emergency Medicine

## 2017-12-17 ENCOUNTER — Encounter (HOSPITAL_BASED_OUTPATIENT_CLINIC_OR_DEPARTMENT_OTHER): Payer: Self-pay | Admitting: Emergency Medicine

## 2017-12-17 ENCOUNTER — Emergency Department (HOSPITAL_BASED_OUTPATIENT_CLINIC_OR_DEPARTMENT_OTHER): Payer: Medicaid Other

## 2017-12-17 DIAGNOSIS — Z87891 Personal history of nicotine dependence: Secondary | ICD-10-CM | POA: Diagnosis not present

## 2017-12-17 DIAGNOSIS — Z79899 Other long term (current) drug therapy: Secondary | ICD-10-CM | POA: Diagnosis not present

## 2017-12-17 DIAGNOSIS — R079 Chest pain, unspecified: Secondary | ICD-10-CM

## 2017-12-17 DIAGNOSIS — I10 Essential (primary) hypertension: Secondary | ICD-10-CM | POA: Diagnosis not present

## 2017-12-17 DIAGNOSIS — E119 Type 2 diabetes mellitus without complications: Secondary | ICD-10-CM | POA: Insufficient documentation

## 2017-12-17 HISTORY — DX: Type 2 diabetes mellitus without complications: E11.9

## 2017-12-17 LAB — CBC WITH DIFFERENTIAL/PLATELET
Basophils Absolute: 0 10*3/uL (ref 0.0–0.1)
Basophils Relative: 0 %
Eosinophils Absolute: 0.3 10*3/uL (ref 0.0–0.7)
Eosinophils Relative: 6 %
HEMATOCRIT: 35 % — AB (ref 36.0–46.0)
Hemoglobin: 11.5 g/dL — ABNORMAL LOW (ref 12.0–15.0)
Lymphocytes Relative: 53 %
Lymphs Abs: 3 10*3/uL (ref 0.7–4.0)
MCH: 26.9 pg (ref 26.0–34.0)
MCHC: 32.9 g/dL (ref 30.0–36.0)
MCV: 82 fL (ref 78.0–100.0)
MONO ABS: 0.3 10*3/uL (ref 0.1–1.0)
Monocytes Relative: 5 %
NEUTROS ABS: 2 10*3/uL (ref 1.7–7.7)
Neutrophils Relative %: 36 %
Platelets: 327 10*3/uL (ref 150–400)
RBC: 4.27 MIL/uL (ref 3.87–5.11)
RDW: 14.9 % (ref 11.5–15.5)
WBC: 5.6 10*3/uL (ref 4.0–10.5)

## 2017-12-17 LAB — BASIC METABOLIC PANEL
Anion gap: 7 (ref 5–15)
BUN: 11 mg/dL (ref 6–20)
CHLORIDE: 104 mmol/L (ref 101–111)
CO2: 25 mmol/L (ref 22–32)
CREATININE: 0.74 mg/dL (ref 0.44–1.00)
Calcium: 8.6 mg/dL — ABNORMAL LOW (ref 8.9–10.3)
GFR calc Af Amer: >60 mL/min (ref >60)
GFR calc non Af Amer: >60 mL/min (ref >60)
GLUCOSE: 141 mg/dL — AB (ref 65–99)
POTASSIUM: 3.8 mmol/L (ref 3.5–5.1)
SODIUM: 136 mmol/L (ref 135–145)

## 2017-12-17 LAB — TROPONIN I

## 2017-12-17 MED ORDER — CYCLOBENZAPRINE HCL 10 MG PO TABS: 10.0000 mg | ORAL_TABLET | Freq: Two times a day (BID) | ORAL | 0 refills | Status: DC | PRN

## 2017-12-17 NOTE — ED Notes (Signed)
Assumed care of patient from [place], [place]. Pt resting quietly. No distress. No complaints. Awaiting disposition/results.

## 2017-12-17 NOTE — ED Triage Notes (Signed)
Patient reports left sided chest pain x 1 week.  Reports pain worsens with movement.  C/o shortness of breath.  Denies nausea/vomiting.

## 2017-12-17 NOTE — Discharge Instructions (Signed)
Your work-up today was overall reassuring.  We suspect a musculoskeletal type chest pain.  Please follow-up with your primary care physician in the next several days for reassessment.  Please medicine to help with your symptoms.  If any symptoms change or worsen, please return to the nearest emergency department.

## 2017-12-17 NOTE — ED Provider Notes (Signed)
[facility] EMERGENCY DEPARTMENT Provider Note   CSN: [id number] Arrival date & time: 12/17/17  0734     History   Chief Complaint Chief Complaint  Patient presents with  . Chest Pain    HPI Kathryn Warren is a 27 y.o. female.  The history is provided by the patient and medical records. No language interpreter was used.  Chest Pain   This is a new problem. The current episode started more than 1 week ago. The problem occurs constantly. The problem has not changed since onset.The pain is associated with raising an arm. The pain is present in the lateral region. The pain is at a severity of 7/10. The pain is moderate. The quality of the pain is described as pressure-like and sharp. The pain does not radiate. Duration of episode(s) is 1 week. The symptoms are aggravated by certain positions. Pertinent negatives include no abdominal pain, no back pain, no cough, no diaphoresis, no exertional chest pressure, no fever, no headaches, no hemoptysis, no irregular heartbeat, no leg pain, no lower extremity edema, no malaise/fatigue, no nausea, no near-syncope, no numbness, no palpitations, no shortness of breath, no sputum production, no syncope and no vomiting. She has tried nothing for the symptoms. The treatment provided no relief.    Past Medical History:  Diagnosis Date  . Diabetes mellitus without complication (HCC)   . Hypertension   . Seasonal allergies     There are no active problems to display for this patient.   Past Surgical History:  Procedure Laterality Date  . CESAREAN SECTION    . DILATION AND CURETTAGE OF UTERUS       OB History    Gravida  2   Para      Term      Preterm      AB      Living        SAB      TAB      Ectopic      Multiple      Live Births               Home Medications    Prior to Admission medications   Medication Sig Start Date End Date Taking? Authorizing Provider  famotidine (PEPCID) 20 MG tablet Take 1  tablet (20 mg total) by mouth 2 (two) times daily. 01/23/14 05/22/14  Street, [place], PA-C    Family History History reviewed. No pertinent family history.  Social History Social History   Tobacco Use  . Smoking status: Former Smoker    Packs/day: 1.00    Types: Cigarettes  . Smokeless tobacco: Never Used  Substance Use Topics  . Alcohol use: Yes    Comment: weekly  . Drug use: No     Allergies   Patient has no known allergies.   Review of Systems Review of Systems  Constitutional: Negative for chills, diaphoresis, fatigue, fever and malaise/fatigue.  HENT: Negative for congestion.   Eyes: Negative for visual disturbance.  Respiratory: Negative for cough, hemoptysis, sputum production, choking, chest tightness, shortness of breath, wheezing and stridor.   Cardiovascular: Positive for chest pain. Negative for palpitations, leg swelling, syncope and near-syncope.  Gastrointestinal: Negative for abdominal pain, constipation, diarrhea, nausea and vomiting.  Genitourinary: Negative for dysuria and flank pain.  Musculoskeletal: Negative for back pain, neck pain and neck stiffness.  Skin: Negative for rash and wound.  Neurological: Negative for light-headedness, numbness and headaches.  Psychiatric/Behavioral: Negative for agitation.  All other systems  reviewed and are negative.    Physical Exam Updated Vital Signs BP 121/78 (BP Location: Left Arm)   Pulse 76   Temp 98.5 F (36.9 C) (Oral)   Resp 20   Ht 6' (1.829 m)   Wt 110.2 kg (243 lb)   LMP 12/12/2017 (Exact Date)   SpO2 100%   BMI 32.96 kg/m   Physical Exam  Constitutional: She is oriented to person, place, and time. She appears well-developed and well-nourished. No distress.  HENT:  Head: Normocephalic and atraumatic.  Nose: Nose normal.  Mouth/Throat: No oropharyngeal exudate.  Eyes: Pupils are equal, round, and reactive to light. EOM are normal.  Neck: Normal range of motion. Neck supple.    Cardiovascular: Normal rate and intact distal pulses.  No murmur heard. Pulmonary/Chest: Effort normal and breath sounds normal. No stridor. No respiratory distress. She has no wheezes. She has no rales. She exhibits tenderness.    Abdominal: Soft. She exhibits no distension. There is no tenderness.  Musculoskeletal: She exhibits no edema or tenderness.  Neurological: She is alert and oriented to person, place, and time. No sensory deficit. She exhibits normal muscle tone.  Skin: Capillary refill takes less than 2 seconds. No rash noted. She is not diaphoretic. No erythema.  Psychiatric: She has a normal mood and affect.  Nursing note and vitals reviewed.    ED Treatments / Results  Labs (all labs ordered are listed, but only abnormal results are displayed) Labs Reviewed  CBC WITH DIFFERENTIAL/PLATELET - Abnormal; Notable for the following components:      Result Value   Hemoglobin 11.5 (*)    HCT 35.0 (*)    All other components within normal limits  BASIC METABOLIC PANEL - Abnormal; Notable for the following components:   Glucose, Bld 141 (*)    Calcium 8.6 (*)    All other components within normal limits  TROPONIN I    EKG EKG Interpretation  Date/Time:  Tuesday Dec 17 2017 07:40:47 EDT Ventricular Rate:  79 PR Interval:  156 QRS Duration: 88 QT Interval:  380 QTC Calculation: 435 R Axis:   56 Text Interpretation:  Normal sinus rhythm Nonspecific T wave abnormality Abnormal ECG When compard to prior, no significant changes seen. No STEMI Confirmed by Theda Belfast ([id number]) on 12/17/2017 7:58:54 AM   Radiology Dg Chest 2 View  Result Date: 12/17/2017 CLINICAL DATA:  Chest pain EXAM: CHEST - 2 VIEW COMPARISON:  10/11/2016 FINDINGS: Heart and mediastinal contours are within normal limits. No focal opacities or effusions. No acute bony abnormality. IMPRESSION: No active cardiopulmonary disease. Electronically Signed   By: Charlett Nose M.D.   On: 12/17/2017 09:00     Procedures Procedures (including critical care time)  Medications Ordered in ED Medications - No data to display   Initial Impression / Assessment and Plan / ED Course  I have reviewed the triage vital signs and the nursing notes.  Pertinent labs & imaging results that were available during my care of the patient were reviewed by me and considered in my medical decision making (see chart for details).     Kathryn Warren is a 27 y.o. female with a past medical history significant for hypertension diabetes who presents with chest pain.  Patient reports over the last week she has had left-sided chest pain that is worsened with different positions and left arm movement.  She reports that she thinks she slept on it wrong causing the pain.  She denies any cardiac history no family  history of cardiac problems.  She denies any nausea, vomiting, or diaphoresis.  She denies palpitations or shortness of breath.  She denies any syncopal episodes.  She reports the pain is in her left axilla and left chest.  She reports it is a cramping and tightness.  She describes it as moderate.  She has not taken medicine to help symptoms.  She denies any specific traumatic injuries.  She denies any fevers, chills, cough, urinary symptoms or GI symptoms.  She denies abdominal pain or back pain.  She denies other complaints.  She denies it being exertional or pleuritic.  No history of DVT or PE and she does not take any birth control medications.  She does not smoke.    On exam, left chest is tender in pain was worsened with left arm movement.  Lungs were clear.  No murmur appreciated.  Back was nontender.  Abdomen nontender.  Normal pulses in upper extremities.  No significant edema in the legs.  EKG showed no significant change from prior.  No STEMI.  Patient is PERC negative and heart score is a 2.  Suspect muscular skeletal pain given the exam and history.  Patient will have laboratory testing including a  troponin as well as a chest x-ray to further evaluate.  If work-up is reassuring, anticipate discharge for conservative management of likely muscular soft tissue pain.  Patient's diagnostic work-up is reassuring.  Troponin was negative.  Suspect muscular skeletal pain.    Patient will follow-up with PCP and be given prescription for pain medicine and muscle relaxant.  Patient understood return precautions.  Patient otherwise or concerns and was discharged in good condition.   Final Clinical Impressions(s) / ED Diagnoses   Final diagnoses:  Chest pain, unspecified type    ED Discharge Orders        Ordered    cyclobenzaprine (FLEXERIL) 10 MG tablet  2 times daily PRN     12/17/17 1114      Clinical Impression: 1. Chest pain, unspecified type     Disposition: Discharge  Condition: Good  I have discussed the results, Dx and Tx plan with the pt(& family if present). He/she/they expressed understanding and agree(s) with the plan. Discharge instructions discussed at great length. Strict return precautions discussed and pt &/or family have verbalized understanding of the instructions. No further questions at time of discharge.    New Prescriptions   CYCLOBENZAPRINE (FLEXERIL) 10 MG TABLET    Take 1 tablet (10 mg total) by mouth 2 (two) times daily as needed for muscle spasms.    Follow Up: Center, [facility] [address] [place] [address] [phone]     [facility] [place] DEPARTMENT [address] [id number] [place] [address] [phone]       Tegeler, Canary Brim, MD 12/17/17 1116

## 2018-05-02 ENCOUNTER — Other Ambulatory Visit: Payer: Self-pay

## 2018-05-02 ENCOUNTER — Emergency Department (HOSPITAL_BASED_OUTPATIENT_CLINIC_OR_DEPARTMENT_OTHER)
Admission: EM | Admit: 2018-05-02 | Discharge: 2018-05-02 | Disposition: A | Discharge status: Home / Self Care | Payer: Medicaid Other | Attending: Emergency Medicine | Admitting: Emergency Medicine

## 2018-05-02 ENCOUNTER — Encounter (HOSPITAL_BASED_OUTPATIENT_CLINIC_OR_DEPARTMENT_OTHER): Payer: Self-pay | Admitting: *Deleted

## 2018-05-02 DIAGNOSIS — Z87891 Personal history of nicotine dependence: Secondary | ICD-10-CM | POA: Insufficient documentation

## 2018-05-02 DIAGNOSIS — E119 Type 2 diabetes mellitus without complications: Secondary | ICD-10-CM | POA: Diagnosis not present

## 2018-05-02 DIAGNOSIS — R1031 Right lower quadrant pain: Secondary | ICD-10-CM | POA: Insufficient documentation

## 2018-05-02 DIAGNOSIS — M545 Low back pain, unspecified: Secondary | ICD-10-CM

## 2018-05-02 DIAGNOSIS — I10 Essential (primary) hypertension: Secondary | ICD-10-CM | POA: Insufficient documentation

## 2018-05-02 DIAGNOSIS — M5489 Other dorsalgia: Secondary | ICD-10-CM | POA: Diagnosis present

## 2018-05-02 DIAGNOSIS — R109 Unspecified abdominal pain: Secondary | ICD-10-CM

## 2018-05-02 LAB — CBC WITH DIFFERENTIAL/PLATELET
Basophils Absolute: 0 10*3/uL (ref 0.0–0.1)
Basophils Relative: 0 %
Eosinophils Absolute: 0.1 10*3/uL (ref 0.0–0.7)
Eosinophils Relative: 2 %
HCT: 36.6 % (ref 36.0–46.0)
Hemoglobin: 11.8 g/dL — ABNORMAL LOW (ref 12.0–15.0)
Lymphocytes Relative: 54 %
Lymphs Abs: 3.3 10*3/uL (ref 0.7–4.0)
MCH: 26.5 pg (ref 26.0–34.0)
MCHC: 32.2 g/dL (ref 30.0–36.0)
MCV: 82.2 fL (ref 78.0–100.0)
Monocytes Absolute: 0.5 10*3/uL (ref 0.1–1.0)
Monocytes Relative: 8 %
Neutro Abs: 2.2 10*3/uL (ref 1.7–7.7)
Neutrophils Relative %: 36 %
Platelets: 305 10*3/uL (ref 150–400)
RBC: 4.45 MIL/uL (ref 3.87–5.11)
RDW: 14.6 % (ref 11.5–15.5)
WBC: 6.1 10*3/uL (ref 4.0–10.5)

## 2018-05-02 LAB — URINALYSIS, ROUTINE W REFLEX MICROSCOPIC
Bilirubin Urine: NEGATIVE
Glucose, UA: NEGATIVE mg/dL
Ketones, ur: NEGATIVE mg/dL
LEUKOCYTES UA: NEGATIVE
NITRITE: NEGATIVE
PH: 6 (ref 5.0–8.0)
Protein, ur: NEGATIVE mg/dL
SPECIFIC GRAVITY, URINE: 1.025 (ref 1.005–1.030)

## 2018-05-02 LAB — COMPREHENSIVE METABOLIC PANEL
ALT: 15 U/L (ref 0–44)
AST: 14 U/L — ABNORMAL LOW (ref 15–41)
Albumin: 4.1 g/dL (ref 3.5–5.0)
Alkaline Phosphatase: 69 U/L (ref 38–126)
Anion gap: 9 (ref 5–15)
BUN: 9 mg/dL (ref 6–20)
CO2: 29 mmol/L (ref 22–32)
Calcium: 9 mg/dL (ref 8.9–10.3)
Chloride: 103 mmol/L (ref 98–111)
Creatinine, Ser: 0.83 mg/dL (ref 0.44–1.00)
GFR calc Af Amer: >60 mL/min (ref >60)
GFR calc non Af Amer: >60 mL/min (ref >60)
Glucose, Bld: 76 mg/dL (ref 70–99)
Potassium: 3.7 mmol/L (ref 3.5–5.1)
Sodium: 141 mmol/L (ref 135–145)
Total Bilirubin: 0.5 mg/dL (ref 0.3–1.2)
Total Protein: 7.4 g/dL (ref 6.5–8.1)

## 2018-05-02 LAB — WET PREP, GENITAL
Clue Cells Wet Prep HPF POC: NONE SEEN
Sperm: NONE SEEN
Trich, Wet Prep: NONE SEEN
Yeast Wet Prep HPF POC: NONE SEEN

## 2018-05-02 LAB — URINALYSIS, MICROSCOPIC (REFLEX)

## 2018-05-02 LAB — PREGNANCY, URINE: PREG TEST UR: NEGATIVE

## 2018-05-02 LAB — CBG MONITORING, ED: Glucose-Capillary: 108 mg/dL — ABNORMAL HIGH (ref 70–99)

## 2018-05-02 NOTE — ED Provider Notes (Signed)
[facility] EMERGENCY DEPARTMENT Provider Note   CSN: [id number] Arrival date & time: 05/02/18  1049     History   Chief Complaint Chief Complaint  Patient presents with  . Back Pain    HPI Kathryn Warren is a 27 y.o. female with history of hypertension, diabetes who presents with a 2-week history of intermittent low back pain and right flank and right lower quadrant pain.  Patient reports it feels like she has a urinary tract infection.  She has had urinary frequency.  She has also had some intermittent nausea, but no vomiting.  She reports she does take metformin, which does make her sick sometimes.  She has not had a period this month, LMP 03/26/2018, however states her periods are somewhat irregular.  She is taken two negative home pregnancy tests.  Denies any fevers, chest pain, shortness of breath, abnormal vaginal bleeding or discharge.  She has no concern for STD exposure.  Patient denies any fevers, recent procedure to back, saddle anesthesia, loss of bowel or bladder control, history of IVDU, known cancer. She denies any heavy lifting.  She reports she has had back pain before, however this feels different and did not have the abdominal pain.  HPI  Past Medical History:  Diagnosis Date  . Diabetes mellitus without complication (HCC)   . Hypertension   . Seasonal allergies     There are no active problems to display for this patient.   Past Surgical History:  Procedure Laterality Date  . CESAREAN SECTION    . DILATION AND CURETTAGE OF UTERUS       OB History    Gravida  2   Para      Term      Preterm      AB      Living        SAB      TAB      Ectopic      Multiple      Live Births               Home Medications    Prior to Admission medications   Medication Sig Start Date End Date Taking? Authorizing Provider  cyclobenzaprine (FLEXERIL) 10 MG tablet Take 1 tablet (10 mg total) by mouth 2 (two) times daily as needed for muscle  spasms. 12/17/17   Tegeler, Canary Brim, MD    Family History History reviewed. No pertinent family history.  Social History Social History   Tobacco Use  . Smoking status: Former Smoker    Packs/day: 1.00    Types: Cigarettes  . Smokeless tobacco: Never Used  Substance Use Topics  . Alcohol use: Yes    Comment: weekly  . Drug use: No     Allergies   Patient has no known allergies.   Review of Systems Review of Systems  Constitutional: Negative for chills and fever.  HENT: Negative for facial swelling and sore throat.   Respiratory: Negative for shortness of breath.   Cardiovascular: Negative for chest pain.  Gastrointestinal: Positive for abdominal pain and nausea. Negative for vomiting.  Genitourinary: Positive for frequency. Negative for dysuria, vaginal bleeding and vaginal discharge.  Musculoskeletal: Positive for back pain.  Skin: Negative for rash and wound.  Neurological: Negative for headaches.  Psychiatric/Behavioral: The patient is not nervous/anxious.      Physical Exam Updated Vital Signs BP 125/90 (BP Location: Right Arm)   Pulse 80   Temp 98 F (36.7 C) (Oral)  Resp 18   LMP 03/26/2018   SpO2 100%   Physical Exam  Constitutional: She appears well-developed and well-nourished. No distress.  HENT:  Head: Normocephalic and atraumatic.  Mouth/Throat: Oropharynx is clear and moist. No oropharyngeal exudate.  Eyes: Pupils are equal, round, and reactive to light. Conjunctivae are normal. Right eye exhibits no discharge. Left eye exhibits no discharge. No scleral icterus.  Neck: Normal range of motion. Neck supple. No thyromegaly present.  Cardiovascular: Normal rate, regular rhythm, normal heart sounds and intact distal pulses. Exam reveals no gallop and no friction rub.  No murmur heard. Pulmonary/Chest: Effort normal and breath sounds normal. No stridor. No respiratory distress. She has no wheezes. She has no rales.  Abdominal: Soft. Bowel  sounds are normal. She exhibits no distension. There is no tenderness. There is no rebound, no guarding, no CVA tenderness and no tenderness at McBurney's point.  Genitourinary: Pelvic exam was performed with patient supine. Cervix exhibits motion tenderness (patient reports discomfort only, no chandelier sign). Cervix exhibits no discharge. Right adnexum displays no mass, no tenderness and no fullness. Left adnexum displays no mass, no tenderness and no fullness. No bleeding in the vagina. Vaginal discharge (white, could be physiologic) found.  Genitourinary Comments: Chaperone present  Musculoskeletal: She exhibits no edema.  No midline cervical, thoracic, or lumbar tenderness; mild bilateral paraspinal tenderness in the lumbar region  Lymphadenopathy:    She has no cervical adenopathy.  Neurological: She is alert. Coordination normal.  Skin: Skin is warm and dry. No rash noted. She is not diaphoretic. No pallor.  Psychiatric: She has a normal mood and affect.  Nursing note and vitals reviewed.    ED Treatments / Results  Labs (all labs ordered are listed, but only abnormal results are displayed) Labs Reviewed  WET PREP, GENITAL - Abnormal; Notable for the following components:      Result Value   WBC, Wet Prep HPF POC MANY (*)    All other components within normal limits  URINALYSIS, ROUTINE W REFLEX MICROSCOPIC - Abnormal; Notable for the following components:   Hgb urine dipstick MODERATE (*)    All other components within normal limits  COMPREHENSIVE METABOLIC PANEL - Abnormal; Notable for the following components:   AST 14 (*)    All other components within normal limits  CBC WITH DIFFERENTIAL/PLATELET - Abnormal; Notable for the following components:   Hemoglobin 11.8 (*)    All other components within normal limits  URINALYSIS, MICROSCOPIC (REFLEX) - Abnormal; Notable for the following components:   Bacteria, UA MANY (*)    All other components within normal limits  CBG  MONITORING, ED - Abnormal; Notable for the following components:   Glucose-Capillary 108 (*)    All other components within normal limits  URINE CULTURE  PREGNANCY, URINE  GC/CHLAMYDIA PROBE AMP ([place]) NOT AT [facility]    EKG None  Radiology No results found.  Procedures Procedures (including critical care time)  Medications Ordered in ED Medications - No data to display   Initial Impression / Assessment and Plan / ED Course  I have reviewed the triage vital signs and the nursing notes.  Pertinent labs & imaging results that were available during my care of the patient were reviewed by me and considered in my medical decision making (see chart for details).     Patient presenting with intermittent low back and right flank pain for the past 2 weeks.  She has history of low back pain.  Patient has no  abdominal or flank tenderness on my exam.  Patient is very well-appearing with stable vitals.  Low suspicion for acute abdominal process or nephrolithiasis, although patient has had persistent hematuria with each UA and her last 4 visits dating back to 2017.  Patient has not yet seen a urologist for this.  Urine pregnancy negative.  Will give follow-up to urology as well as OB/GYN, in addition to PCP.  Labs are unremarkable.  Wet prep is negative.  GC/chlamydia sent and pending.  Patient had to leave prior to labs resulting to pick up her child, but I spoke with the patient and made her aware of her results.  No indication for imaging today, as patient had no adnexal or abdominal tenderness.  Patient advised to take ibuprofen as needed.  Strict return precautions given.  Patient understands and agrees with plan.  Patient vitals stable throughout ED course and discharged in satisfactory condition.  Final Clinical Impressions(s) / ED Diagnoses   Final diagnoses:  Acute right-sided low back pain without sciatica  Right flank pain    ED Discharge Orders    None       Emi Holes, PA-C 05/02/18 1507    Gwyneth Sprout, MD 05/04/18 2037

## 2018-05-02 NOTE — ED Triage Notes (Signed)
Pt reports back pain off and on x 2 weeks, along with some rlq pain, none present at this time. Pt is amb with quick steady gait, denies any injury or trauma.

## 2018-05-02 NOTE — Discharge Instructions (Signed)
Please follow-up with your doctor for further evaluation and treatment.  Please understand you are leaving AGAINST MEDICAL ADVICE considering we do not have all of your labs back today.  Please return the emergency department if you develop any new or worsening symptoms.  Please follow-up with an OB/GYN for further evaluation and treatment of your irregular periods.  Please follow-up with Dr. Alvester Morin with urology for further evaluation and treatment of blood in your urine.

## 2018-05-02 NOTE — ED Notes (Signed)
ED Provider at bedside. 

## 2018-05-03 LAB — URINE CULTURE

## 2018-05-05 LAB — GC/CHLAMYDIA PROBE AMP (~~LOC~~) NOT AT ARMC
Chlamydia: NEGATIVE
Neisseria Gonorrhea: NEGATIVE

## 2018-12-12 ENCOUNTER — Telehealth: Payer: Self-pay | Admitting: Plastic Surgery

## 2018-12-12 NOTE — Telephone Encounter (Signed)
Called patient to confirm appointment scheduled for Monday. Patient answered the following questions: °1.Has the patient traveled outside of the state of [place] at all within the past 6 weeks? No °2.Does the patient have a fever or cough at all? No °3.Has the patient been tested for COVID? Had a positive COVID test? No °4. Has the patient been in contact with anyone who has tested positive? No ° °

## 2018-12-15 ENCOUNTER — Encounter: Payer: Self-pay | Admitting: Plastic Surgery

## 2018-12-15 ENCOUNTER — Telehealth: Payer: Self-pay

## 2018-12-15 ENCOUNTER — Ambulatory Visit (INDEPENDENT_AMBULATORY_CARE_PROVIDER_SITE_OTHER): Payer: Medicaid Other | Admitting: Plastic Surgery

## 2018-12-15 ENCOUNTER — Other Ambulatory Visit: Payer: Self-pay

## 2018-12-15 DIAGNOSIS — M542 Cervicalgia: Secondary | ICD-10-CM | POA: Diagnosis not present

## 2018-12-15 DIAGNOSIS — N62 Hypertrophy of breast: Secondary | ICD-10-CM | POA: Diagnosis not present

## 2018-12-15 DIAGNOSIS — M549 Dorsalgia, unspecified: Secondary | ICD-10-CM | POA: Insufficient documentation

## 2018-12-15 DIAGNOSIS — G8929 Other chronic pain: Secondary | ICD-10-CM | POA: Diagnosis not present

## 2018-12-15 DIAGNOSIS — M546 Pain in thoracic spine: Secondary | ICD-10-CM | POA: Diagnosis not present

## 2018-12-15 NOTE — Telephone Encounter (Signed)
-  Release of information signed by pt & faxed to PCP- Darryl Lent : fax# [phone] for information regarding prior approval for surgery

## 2018-12-15 NOTE — Progress Notes (Signed)
Patient ID: Kathryn Warren, female    DOB: 1990/10/04, 28 y.o.   MRN: [id number]   Chief Complaint  Patient presents with  . Breast Problem    Mammary Hyperplasia: The patient is a 28 y.o. female with a history of mammary hyperplasia for several years.  She has extremely large breasts causing symptoms that include the following: Back pain (upper and lower) and neck pain. She frequently pins bra cups higher on straps for better lift and relief. Notices relief when holding breast up in her hands. Shoulder straps causing grooves, pain occasionally requiring padding. Pain medication is sometimes required with motrin and tylenol.  Activities that are hindered by enlarged breasts include: running, exercise, sitting with her special needs son.  Her breasts are extremely large and fairly symmetric.  She has hyperpigmentation of the inframammary area on both sides.  The sternal to nipple distance on the right is 42 cm and the left is 38 cm.  The IMF distance is 26 cm.  She is 6 feet 1 inches tall and weighs 235 pounds.  Preoperative bra size = 48 I cup but should be in a 38 bra cup.  The estimated excess breast tissue to be removed at the time of surgery = 1000 grams on the left and 1000 grams on the right.  Mammogram history: has not had a mammogram but I recommend she get one prior to surgery as her grandmother died of breast cancer.  She had very minimal success with breast feeding. She does have a family history of breast cancer.  She is always been on the bigger side but got much bigger after her pregnancy.  Her boy is now 28 years old.  She has been good about keeping her weight down but it does not seem to make any difference in her breast area.  She has seen several physicians regarding the neck and back pain.  And she has taken oxycodone for the back pain with some help but no resolution of the problem.  She has a slight pectus carinatum.  Her breasts are extremely large and heavy.   Review of  Systems  Constitutional: Positive for activity change. Negative for appetite change.  HENT: Negative.   Eyes: Negative.   Respiratory: Negative for chest tightness and shortness of breath.   Cardiovascular: Negative.  Negative for leg swelling.  Gastrointestinal: Negative.  Negative for abdominal distention, abdominal pain and anal bleeding.  Genitourinary: Negative.   Musculoskeletal: Positive for back pain and neck pain.  Skin: Negative for color change and wound.  Psychiatric/Behavioral: Negative.     Past Medical History:  Diagnosis Date  . Diabetes mellitus without complication (HCC)   . Hypertension   . Seasonal allergies     Past Surgical History:  Procedure Laterality Date  . CESAREAN SECTION    . DILATION AND CURETTAGE OF UTERUS        Current Outpatient Medications:  .  glipiZIDE (GLUCOTROL) 5 MG tablet, Take by mouth daily before breakfast., Disp: , Rfl:  .  oxyCODONE-acetaminophen (PERCOCET) 10-325 MG tablet, Take 1 tablet by mouth every 4 (four) hours as needed for pain., Disp: , Rfl:  .  cyclobenzaprine (FLEXERIL) 10 MG tablet, Take 1 tablet (10 mg total) by mouth 2 (two) times daily as needed for muscle spasms., Disp: 20 tablet, Rfl: 0   Objective:   Vitals:   12/15/18 0943  BP: 122/87  Pulse: 82  Temp: 98.2 F (36.8 C)  SpO2: 98%  Physical Exam Vitals signs and nursing note reviewed.  Constitutional:      Appearance: Normal appearance.  HENT:     Head: Normocephalic and atraumatic.     Right Ear: External ear normal.     Nose: Nose normal.     Mouth/Throat:     Mouth: Mucous membranes are moist.  Eyes:     Extraocular Movements: Extraocular movements intact.  Neck:     Musculoskeletal: Muscular tenderness present.  Cardiovascular:     Rate and Rhythm: Normal rate and regular rhythm.     Pulses: Normal pulses.  Pulmonary:     Effort: Pulmonary effort is normal. No respiratory distress.  Chest:     Chest wall: No tenderness.  Abdominal:      General: Abdomen is flat. There is no distension.     Tenderness: There is no abdominal tenderness. There is no guarding.  Lymphadenopathy:     Cervical: No cervical adenopathy.  Neurological:     General: No focal deficit present.     Mental Status: She is alert and oriented to person, place, and time.  Psychiatric:        Mood and Affect: Mood normal.        Behavior: Behavior normal.     Assessment & Plan:  Neck pain  Chronic bilateral thoracic back pain  Symptomatic mammary hypertrophy Strongly suggest bilateral breast reduction.  I have requested a mammogram prior to surgery due to her family history.    Pictures were taken and placed in the chart with the patient permission.  Alena Billslaire S , DO

## 2019-01-13 ENCOUNTER — Telehealth: Payer: Self-pay | Admitting: Plastic Surgery

## 2019-01-13 NOTE — Telephone Encounter (Signed)
Called patient to communicate the denial reason from [facility] regarding the prior authorization request submitted for bilateral breast reduction. The authorization request was denied due to the lack of medical necessity according to policy guidelines, no relation to breast cancer or any other high risk benign disease that interferes with the ability to perform a mammogram. The patient does not have breast cancer or any associated disease, that is documented in her record. I advised the patient that she can contact her PCP to request a referral to physical therapy for back and neck pain that stems from the weight of her breast. The patient indicated that she already goes to PT with her son, but I explained that the PT would need to be for her treatment of the condition. The patient is going to contact her PCP to request a letter stating that she is unable to help her son with his therapy, and is unable to perform any routine daily activities of living, and would like to resubmit with that letter of disability. We will await the correspondence and have Dr. Marla Roe review for resubmission.

## 2019-03-21 ENCOUNTER — Other Ambulatory Visit: Payer: Self-pay

## 2019-03-21 ENCOUNTER — Emergency Department (HOSPITAL_BASED_OUTPATIENT_CLINIC_OR_DEPARTMENT_OTHER)
Admission: EM | Admit: 2019-03-21 | Discharge: 2019-03-21 | Disposition: A | Discharge status: Home / Self Care | Payer: Medicaid Other | Attending: Emergency Medicine | Admitting: Emergency Medicine

## 2019-03-21 ENCOUNTER — Encounter (HOSPITAL_BASED_OUTPATIENT_CLINIC_OR_DEPARTMENT_OTHER): Payer: Self-pay | Admitting: Emergency Medicine

## 2019-03-21 DIAGNOSIS — N3000 Acute cystitis without hematuria: Secondary | ICD-10-CM | POA: Diagnosis not present

## 2019-03-21 DIAGNOSIS — E119 Type 2 diabetes mellitus without complications: Secondary | ICD-10-CM | POA: Insufficient documentation

## 2019-03-21 DIAGNOSIS — R1031 Right lower quadrant pain: Secondary | ICD-10-CM | POA: Diagnosis present

## 2019-03-21 DIAGNOSIS — Z87891 Personal history of nicotine dependence: Secondary | ICD-10-CM | POA: Diagnosis not present

## 2019-03-21 DIAGNOSIS — R109 Unspecified abdominal pain: Secondary | ICD-10-CM

## 2019-03-21 DIAGNOSIS — I10 Essential (primary) hypertension: Secondary | ICD-10-CM | POA: Insufficient documentation

## 2019-03-21 DIAGNOSIS — Z7984 Long term (current) use of oral hypoglycemic drugs: Secondary | ICD-10-CM | POA: Diagnosis not present

## 2019-03-21 LAB — URINALYSIS, ROUTINE W REFLEX MICROSCOPIC
Bilirubin Urine: NEGATIVE
Glucose, UA: NEGATIVE mg/dL
Ketones, ur: NEGATIVE mg/dL
Leukocytes,Ua: NEGATIVE
Nitrite: NEGATIVE
Protein, ur: NEGATIVE mg/dL
Specific Gravity, Urine: >1.03 — ABNORMAL HIGH (ref 1.005–1.030)
pH: 6 (ref 5.0–8.0)

## 2019-03-21 LAB — URINALYSIS, MICROSCOPIC (REFLEX)

## 2019-03-21 LAB — PREGNANCY, URINE: Preg Test, Ur: NEGATIVE

## 2019-03-21 MED ORDER — IBUPROFEN 600 MG PO TABS: 600.0000 mg | ORAL_TABLET | Freq: Three times a day (TID) | ORAL | 0 refills | Status: DC | PRN

## 2019-03-21 MED ORDER — NITROFURANTOIN MONOHYD MACRO 100 MG PO CAPS: 100.0000 mg | ORAL_CAPSULE | Freq: Two times a day (BID) | ORAL | 0 refills | Status: DC

## 2019-03-21 NOTE — ED Triage Notes (Signed)
Right side/flank pain x 3 days, no other symtoms

## 2019-03-21 NOTE — ED Provider Notes (Signed)
[place] EMERGENCY DEPARTMENT Provider Note   CSN: [id number] Arrival date & time: 03/21/19  7253    History   Chief Complaint Chief Complaint  Patient presents with  . Flank Pain    HPI Kathryn Warren is a 28 y.o. female.     HPI Patient presents to the emergency room for evaluation of right-sided back and flank pain.  Patient states she has had a constant ache in that area for the last few days.  This morning it moved towards the other side.  Patient denies any other symptoms with it.  She is not having any pain in her extremities.  She denies having any abdominal pain.  She is not having any urinary frequency or burning.  Patient has had urinary tract infections before in the past and back pain was her primary complaint.  She wonders if she has another urinary tract infection that is why she came in for evaluation. Past Medical History:  Diagnosis Date  . Diabetes mellitus without complication ([place])   . Hypertension   . Seasonal allergies     Patient Active Problem List   Diagnosis Date Noted  . Neck pain 12/15/2018  . Back pain 12/15/2018  . 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 CESAREAN SECTION    . DILATION AND CURETTAGE OF UTERUS       OB History    Gravida  2   Para      Term      Preterm      AB      Living        SAB      TAB      Ectopic      Multiple      Live Births               Home Medications    Prior to Admission medications   Medication Sig Start Date End Date Taking? Authorizing Provider  cyclobenzaprine (FLEXERIL) 10 MG tablet Take 1 tablet (10 mg total) by mouth 2 (two) times daily as needed for muscle spasms. 12/17/17   Tegeler, Gwenyth Allegra, MD  glipiZIDE (GLUCOTROL) 5 MG tablet Take by mouth daily before breakfast.    [provider]  ibuprofen (ADVIL) 600 MG tablet Take 1 tablet (600 mg total) by mouth every 8 (eight) hours as  needed. 03/21/19   Dorie Rank, MD  nitrofurantoin, macrocrystal-monohydrate, (MACROBID) 100 MG capsule Take 1 capsule (100 mg total) by mouth 2 (two) times daily. 03/21/19   Dorie Rank, MD  oxyCODONE-acetaminophen (PERCOCET) 10-325 MG tablet Take 1 tablet by mouth every 4 (four) hours as needed for pain.    [provider]    Family History No family history on file.  Social History Social History   Tobacco Use  . Smoking status: Former Smoker    Packs/day: 1.00    Types: Cigarettes  . Smokeless tobacco: Never Used  Substance Use Topics  . Alcohol use: Yes    Comment: weekly  . Drug use: No     Allergies   Patient has no known allergies.   Review of Systems Review of Systems  Constitutional: Negative for fever.  Gastrointestinal: Negative for abdominal pain.  All other systems reviewed and are negative.    Physical Exam Updated Vital Signs BP (!) 148/92 (BP Location: Right Arm)   Pulse 76   Temp 98.3 F (36.8 C) (Oral)   Resp 16  Ht 1.829 m (6')   Wt 106.6 kg   LMP 03/15/2019   SpO2 95%   BMI 31.87 kg/m   Physical Exam Vitals signs and nursing note reviewed.  Constitutional:      General: She is not in acute distress.    Appearance: She is well-developed.  HENT:     Head: Normocephalic and atraumatic.     Right Ear: External ear normal.     Left Ear: External ear normal.  Eyes:     General: No scleral icterus.       Right eye: No discharge.        Left eye: No discharge.     Conjunctiva/sclera: Conjunctivae normal.  Neck:     Musculoskeletal: Neck supple.     Trachea: No tracheal deviation.  Cardiovascular:     Rate and Rhythm: Normal rate and regular rhythm.  Pulmonary:     Effort: Pulmonary effort is normal. No respiratory distress.     Breath sounds: Normal breath sounds. No stridor. No wheezing or rales.  Abdominal:     General: Bowel sounds are normal. There is no distension.     Palpations: Abdomen is soft.     Tenderness: There  is no abdominal tenderness. There is no guarding or rebound.  Musculoskeletal:        General: No tenderness.     Comments: No focal areas of tenderness, full range of motion  Skin:    General: Skin is warm and dry.     Findings: No rash.     Comments: No rash noted specifically on the back/flank  Neurological:     Mental Status: She is alert.     Cranial Nerves: No cranial nerve deficit (no facial droop, extraocular movements intact, no slurred speech).     Sensory: No sensory deficit.     Motor: No abnormal muscle tone or seizure activity.     Coordination: Coordination normal.      ED Treatments / Results  Labs (all labs ordered are listed, but only abnormal results are displayed) Labs Reviewed  URINALYSIS, ROUTINE W REFLEX MICROSCOPIC - Abnormal; Notable for the following components:      Result Value   APPearance HAZY (*)    Specific Gravity, Urine >1.030 (*)    Hgb urine dipstick MODERATE (*)    All other components within normal limits  URINALYSIS, MICROSCOPIC (REFLEX) - Abnormal; Notable for the following components:   Bacteria, UA MANY (*)    All other components within normal limits  URINE CULTURE  PREGNANCY, URINE    EKG None  Radiology No results found.  Procedures Procedures (including critical care time)  Medications Ordered in ED Medications - No data to display   Initial Impression / Assessment and Plan / ED Course  I have reviewed the triage vital signs and the nursing notes.  Pertinent labs & imaging results that were available during my care of the patient were reviewed by me and considered in my medical decision making (see chart for details).   Urinalysis does show many bacteria.  No leukocyte esterase or nitrite.  Not definitive for a urinary tract infection however patient is having symptoms that she has had with prior UTIs.  I will give her prescription for Macrobid and send off a urine culture.  Did discuss possibility patient that this  may not indeed be a UTI.  Recommend outpatient follow-up if symptoms persist.  Final Clinical Impressions(s) / ED Diagnoses   Final diagnoses:  Flank  pain  Acute cystitis without hematuria    ED Discharge Orders         Ordered    nitrofurantoin, macrocrystal-monohydrate, (MACROBID) 100 MG capsule  2 times daily     03/21/19 0940    ibuprofen (ADVIL) 600 MG tablet  Every 8 hours PRN     03/21/19 0940           Linwood DibblesKnapp, Dong Nimmons, MD 03/21/19 0945

## 2019-03-21 NOTE — Discharge Instructions (Signed)
Take the antibiotics as prescribed, follow-up with your doctor if the symptoms are not getting better by next week, return for trouble with fevers or vomiting

## 2019-03-22 LAB — URINE CULTURE: Culture: >=100000 — AB

## 2019-05-22 ENCOUNTER — Emergency Department (HOSPITAL_BASED_OUTPATIENT_CLINIC_OR_DEPARTMENT_OTHER)
Admission: EM | Admit: 2019-05-22 | Discharge: 2019-05-22 | Disposition: A | Discharge status: Home / Self Care | Payer: Medicaid Other | Attending: Emergency Medicine | Admitting: Emergency Medicine

## 2019-05-22 ENCOUNTER — Other Ambulatory Visit: Payer: Self-pay

## 2019-05-22 ENCOUNTER — Encounter (HOSPITAL_BASED_OUTPATIENT_CLINIC_OR_DEPARTMENT_OTHER): Payer: Self-pay | Admitting: *Deleted

## 2019-05-22 DIAGNOSIS — Z87891 Personal history of nicotine dependence: Secondary | ICD-10-CM | POA: Insufficient documentation

## 2019-05-22 DIAGNOSIS — O99891 Other specified diseases and conditions complicating pregnancy: Secondary | ICD-10-CM | POA: Insufficient documentation

## 2019-05-22 DIAGNOSIS — Z7984 Long term (current) use of oral hypoglycemic drugs: Secondary | ICD-10-CM | POA: Diagnosis not present

## 2019-05-22 DIAGNOSIS — R110 Nausea: Secondary | ICD-10-CM | POA: Insufficient documentation

## 2019-05-22 DIAGNOSIS — O24119 Pre-existing diabetes mellitus, type 2, in pregnancy, unspecified trimester: Secondary | ICD-10-CM | POA: Insufficient documentation

## 2019-05-22 DIAGNOSIS — O10019 Pre-existing essential hypertension complicating pregnancy, unspecified trimester: Secondary | ICD-10-CM | POA: Diagnosis not present

## 2019-05-22 DIAGNOSIS — Z3201 Encounter for pregnancy test, result positive: Secondary | ICD-10-CM

## 2019-05-22 DIAGNOSIS — Z3A00 Weeks of gestation of pregnancy not specified: Secondary | ICD-10-CM | POA: Diagnosis not present

## 2019-05-22 LAB — URINALYSIS, ROUTINE W REFLEX MICROSCOPIC
Bilirubin Urine: NEGATIVE
Glucose, UA: NEGATIVE mg/dL
Ketones, ur: NEGATIVE mg/dL
Leukocytes,Ua: NEGATIVE
Nitrite: NEGATIVE
Protein, ur: NEGATIVE mg/dL
Specific Gravity, Urine: 1.02 (ref 1.005–1.030)
pH: 6.5 (ref 5.0–8.0)

## 2019-05-22 LAB — URINALYSIS, MICROSCOPIC (REFLEX)

## 2019-05-22 LAB — PREGNANCY, URINE: Preg Test, Ur: POSITIVE — AB

## 2019-05-22 LAB — CBG MONITORING, ED: Glucose-Capillary: 103 mg/dL — ABNORMAL HIGH (ref 70–99)

## 2019-05-22 MED ORDER — PRENATAL COMPLETE 14-0.4 MG PO TABS: 1.0000 | ORAL_TABLET | Freq: Every day | ORAL | 0 refills | Status: AC

## 2019-05-22 MED ORDER — ONDANSETRON HCL 8 MG PO TABS
4.0000 mg | ORAL_TABLET | Freq: Once | ORAL | Status: AC
  Administered 2019-05-22: 4 mg via ORAL
  Filled 2019-05-22: qty 1

## 2019-05-22 MED ORDER — ONDANSETRON HCL 4 MG PO TABS: 4.0000 mg | ORAL_TABLET | Freq: Four times a day (QID) | ORAL | 0 refills | Status: AC

## 2019-05-22 NOTE — ED Notes (Signed)
Patient has not been taking her blood sugar med and has not been checking her blood sugar for awhile.

## 2019-05-22 NOTE — Discharge Instructions (Addendum)
You may take Zofran as directed for your nausea.  Eat small frequent meals and make sure to stay well-hydrated.    Your urinalysis today did not show any signs of obvious infection however it was sent for urine culture.  If the culture grows back any bacteria and you need to start on antibiotics the hospital will call you and let you know.  Please call your OB/GYN or contact the OB/GYN provided on your discharge paperwork to set up an appointment for follow-up.  Please start taking prenatal vitamins.  Please return to the emergency department for any new or worsening symptoms including fevers, abdominal pain, vaginal bleeding.

## 2019-05-22 NOTE — ED Provider Notes (Addendum)
[facility] EMERGENCY DEPARTMENT Provider Note   CSN: [id number] Arrival date & time: 05/22/19  1101     History   Chief Complaint Chief Complaint  Patient presents with  . Nausea    HPI Kathryn Warren is a 28 y.o. female.     HPI   28 year old female with history of diabetes, hypertension, who presents the emergency department today for evaluation of nausea for the last 3 days.  She denies any vomiting, diarrhea, constipation, abdominal pain.  Denies dysuria, frequency, urgency, vaginal bleeding or abnormal vaginal discharge.  States she has had similar symptoms in the past when she was pregnant.  She states she is not concerned for pregnancy at this time.  Her LMP was in August.  Reports she has been noncompliant with her diabetes medications.  Past Medical History:  Diagnosis Date  . Diabetes mellitus without complication (HCC)   . Hypertension   . Seasonal allergies     Patient Active Problem List   Diagnosis Date Noted  . Neck pain 12/15/2018  . Back pain 12/15/2018  . 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 CESAREAN SECTION    . DILATION AND CURETTAGE OF UTERUS       OB History    Gravida  3   Para      Term      Preterm      AB  1   Living        SAB      TAB      Ectopic      Multiple      Live Births               Home Medications    Prior to Admission medications   Medication Sig Start Date End Date Taking? Authorizing Provider  glipiZIDE (GLUCOTROL) 5 MG tablet Take by mouth daily before breakfast.    [provider]  ondansetron (ZOFRAN) 4 MG tablet Take 1 tablet (4 mg total) by mouth every 6 (six) hours. 05/22/19   Kathryn Warren S, PA-C  Prenatal Vit-Fe Fumarate-FA (PRENATAL COMPLETE) 14-0.4 MG TABS Take 1 tablet by mouth daily. 05/22/19 06/21/19  Kathryn Snelgrove S, PA-C    Family History History reviewed. No pertinent family history.   Social History Social History   Tobacco Use  . Smoking status: Former Smoker    Packs/day: 1.00    Types: Cigarettes  . Smokeless tobacco: Never Used  Substance Use Topics  . Alcohol use: Yes    Comment: ocassionally-liquor  . Drug use: No     Allergies   Patient has no known allergies.   Review of Systems Review of Systems  Constitutional: Negative for fever.  Eyes: Negative for visual disturbance.  Respiratory: Negative for shortness of breath.   Cardiovascular: Negative for chest pain.  Gastrointestinal: Positive for nausea. Negative for abdominal pain, constipation, diarrhea and vomiting.  Genitourinary: Negative for dysuria, flank pain, frequency, hematuria, pelvic pain, vaginal bleeding and vaginal discharge.  Musculoskeletal: Negative for back pain.     Physical Exam Updated Vital Signs BP 121/86 (BP Location: Right Arm)   Pulse 98   Temp (!) 97.3 F (36.3 C) (Oral)   Resp 16   Ht 6\' 1"  (1.854 m)   Wt 106.6 kg   SpO2 100%   BMI 31.00 kg/m   Physical Exam Vitals signs and nursing note reviewed.  Constitutional:      General: She is  not in acute distress.    Appearance: She is well-developed.  HENT:     Head: Normocephalic and atraumatic.  Eyes:     Conjunctiva/sclera: Conjunctivae normal.  Neck:     Musculoskeletal: Neck supple.  Cardiovascular:     Rate and Rhythm: Normal rate and regular rhythm.  Pulmonary:     Effort: Pulmonary effort is normal.     Breath sounds: Normal breath sounds.  Abdominal:     Palpations: Abdomen is soft.     Tenderness: There is no abdominal tenderness. There is no guarding.  Musculoskeletal: Normal range of motion.  Skin:    General: Skin is warm and dry.  Neurological:     Mental Status: She is alert.      ED Treatments / Results  Labs (all labs ordered are listed, but only abnormal results are displayed) Labs Reviewed  URINE CULTURE - Abnormal; Notable for the following components:      Result Value    Culture   (*)    Value: 20,000 COLONIES/mL MULTIPLE SPECIES PRESENT, SUGGEST RECOLLECTION   All other components within normal limits  PREGNANCY, URINE - Abnormal; Notable for the following components:   Preg Test, Ur POSITIVE (*)    All other components within normal limits  URINALYSIS, ROUTINE W REFLEX MICROSCOPIC - Abnormal; Notable for the following components:   Hgb urine dipstick MODERATE (*)    All other components within normal limits  URINALYSIS, MICROSCOPIC (REFLEX) - Abnormal; Notable for the following components:   Bacteria, UA MANY (*)    All other components within normal limits  CBG MONITORING, ED - Abnormal; Notable for the following components:   Glucose-Capillary 103 (*)    All other components within normal limits    EKG None  Radiology No results found.  Procedures Procedures (including critical care time)  Medications Ordered in ED Medications  ondansetron (ZOFRAN) tablet 4 mg (4 mg Oral Given 05/22/19 1219)     Initial Impression / Assessment and Plan / ED Course  I have reviewed the triage vital signs and the nursing notes.  Pertinent labs & imaging results that were available during my care of the patient were reviewed by me and considered in my medical decision making (see chart for details).    Final Clinical Impressions(Warren) / ED Diagnoses   Final diagnoses:  Positive pregnancy test  Nausea   28 year old female with history of diabetes, hypertension, who presents the emergency department today for evaluation of nausea for the last 3 days.  She denies any vomiting, diarrhea, constipation, abdominal pain.  Denies dysuria, frequency, urgency, vaginal bleeding or abnormal vaginal discharge.  States she has had similar symptoms in the past when she was pregnant.  She states she is not concerned for pregnancy at this time.  Her LMP was in August.  She also reported noncompliance with diabetes medications.  CBG is normal today.  Doubt DKA. Urine  pregnancy test is positive.  I suspect this is the cause of her symptoms. UA w/o evidence of UTI. Lower suspicion for UTI however, culture sent.   Advised her to follow-up with OB/GYN and start taking prenatal vitamins.  Advised her to return to the ED for new or worsening symptoms in the meantime.  She voiced understanding of the plan and reasons to return.  All questions answered patient still for discharge.  ED Discharge Orders         Ordered    ondansetron (ZOFRAN) 4 MG tablet  Every 6 hours  05/22/19 1216    Prenatal Vit-Fe Fumarate-FA (PRENATAL COMPLETE) 14-0.4 MG TABS  Daily     05/22/19 1216           Kathryn Warren S, PA-C 05/22/19 1234    Virgina Norfolk, DO 05/22/19 1349    Kathryn Warren S, PA-C 06/01/19 1636    Virgina Norfolk, DO 06/03/19 1231

## 2019-05-22 NOTE — ED Triage Notes (Signed)
Nauseated for 3 days

## 2019-05-23 LAB — URINE CULTURE: Culture: 20000 — AB

## 2019-06-06 ENCOUNTER — Encounter (HOSPITAL_BASED_OUTPATIENT_CLINIC_OR_DEPARTMENT_OTHER): Payer: Self-pay | Admitting: Emergency Medicine

## 2019-06-06 ENCOUNTER — Emergency Department (HOSPITAL_BASED_OUTPATIENT_CLINIC_OR_DEPARTMENT_OTHER)
Admission: EM | Admit: 2019-06-06 | Discharge: 2019-06-06 | Disposition: A | Discharge status: Home / Self Care | Payer: Medicaid Other | Attending: Emergency Medicine | Admitting: Emergency Medicine

## 2019-06-06 ENCOUNTER — Other Ambulatory Visit: Payer: Self-pay

## 2019-06-06 DIAGNOSIS — O210 Mild hyperemesis gravidarum: Secondary | ICD-10-CM | POA: Diagnosis present

## 2019-06-06 DIAGNOSIS — E119 Type 2 diabetes mellitus without complications: Secondary | ICD-10-CM | POA: Diagnosis not present

## 2019-06-06 DIAGNOSIS — Z79899 Other long term (current) drug therapy: Secondary | ICD-10-CM | POA: Diagnosis not present

## 2019-06-06 DIAGNOSIS — R1013 Epigastric pain: Secondary | ICD-10-CM | POA: Diagnosis not present

## 2019-06-06 DIAGNOSIS — Z7984 Long term (current) use of oral hypoglycemic drugs: Secondary | ICD-10-CM | POA: Insufficient documentation

## 2019-06-06 DIAGNOSIS — I10 Essential (primary) hypertension: Secondary | ICD-10-CM | POA: Insufficient documentation

## 2019-06-06 LAB — URINALYSIS, MICROSCOPIC (REFLEX)

## 2019-06-06 LAB — URINALYSIS, ROUTINE W REFLEX MICROSCOPIC
Glucose, UA: NEGATIVE mg/dL
Ketones, ur: >80 mg/dL — AB
Leukocytes,Ua: NEGATIVE
Nitrite: NEGATIVE
Protein, ur: 30 mg/dL — AB
Specific Gravity, Urine: >1.03 — ABNORMAL HIGH (ref 1.005–1.030)
pH: 6 (ref 5.0–8.0)

## 2019-06-06 LAB — COMPREHENSIVE METABOLIC PANEL
ALT: 12 U/L (ref 0–44)
AST: 15 U/L (ref 15–41)
Albumin: 4 g/dL (ref 3.5–5.0)
Alkaline Phosphatase: 53 U/L (ref 38–126)
Anion gap: 12 (ref 5–15)
BUN: 11 mg/dL (ref 6–20)
CO2: 19 mmol/L — ABNORMAL LOW (ref 22–32)
Calcium: 9.5 mg/dL (ref 8.9–10.3)
Chloride: 105 mmol/L (ref 98–111)
Creatinine, Ser: 0.77 mg/dL (ref 0.44–1.00)
GFR calc Af Amer: >60 mL/min (ref >60)
GFR calc non Af Amer: >60 mL/min (ref >60)
Glucose, Bld: 90 mg/dL (ref 70–99)
Potassium: 3.8 mmol/L (ref 3.5–5.1)
Sodium: 136 mmol/L (ref 135–145)
Total Bilirubin: 0.8 mg/dL (ref 0.3–1.2)
Total Protein: 7.5 g/dL (ref 6.5–8.1)

## 2019-06-06 LAB — CBC WITH DIFFERENTIAL/PLATELET
Abs Immature Granulocytes: 0.01 10*3/uL (ref 0.00–0.07)
Basophils Absolute: 0 10*3/uL (ref 0.0–0.1)
Basophils Relative: 1 %
Eosinophils Absolute: 0 10*3/uL (ref 0.0–0.5)
Eosinophils Relative: 0 %
HCT: 39.3 % (ref 36.0–46.0)
Hemoglobin: 12.4 g/dL (ref 12.0–15.0)
Immature Granulocytes: 0 %
Lymphocytes Relative: 44 %
Lymphs Abs: 2.3 10*3/uL (ref 0.7–4.0)
MCH: 27.6 pg (ref 26.0–34.0)
MCHC: 31.6 g/dL (ref 30.0–36.0)
MCV: 87.3 fL (ref 80.0–100.0)
Monocytes Absolute: 0.4 10*3/uL (ref 0.1–1.0)
Monocytes Relative: 8 %
Neutro Abs: 2.5 10*3/uL (ref 1.7–7.7)
Neutrophils Relative %: 47 %
Platelets: 273 10*3/uL (ref 150–400)
RBC: 4.5 MIL/uL (ref 3.87–5.11)
RDW: 14.4 % (ref 11.5–15.5)
WBC: 5.2 10*3/uL (ref 4.0–10.5)
nRBC: 0 % (ref 0.0–0.2)

## 2019-06-06 LAB — LIPASE, BLOOD: Lipase: 39 U/L (ref 11–51)

## 2019-06-06 MED ORDER — DIPHENHYDRAMINE HCL 50 MG/ML IJ SOLN
12.5000 mg | Freq: Once | INTRAMUSCULAR | Status: AC
  Administered 2019-06-06: 09:00:00 12.5 mg via INTRAVENOUS
  Filled 2019-06-06: qty 1

## 2019-06-06 MED ORDER — FAMOTIDINE 20 MG PO TABS
20.0000 mg | ORAL_TABLET | Freq: Once | ORAL | Status: AC
  Administered 2019-06-06: 20 mg via ORAL
  Filled 2019-06-06: qty 1

## 2019-06-06 MED ORDER — SODIUM CHLORIDE 0.9 % IV BOLUS
1000.0000 mL | Freq: Once | INTRAVENOUS | Status: AC
  Administered 2019-06-06: 1000 mL via INTRAVENOUS

## 2019-06-06 MED ORDER — METOCLOPRAMIDE HCL 5 MG/ML IJ SOLN
10.0000 mg | Freq: Once | INTRAMUSCULAR | Status: AC
  Administered 2019-06-06: 09:00:00 10 mg via INTRAVENOUS
  Filled 2019-06-06: qty 2

## 2019-06-06 NOTE — ED Notes (Signed)
Patient is resting comfortably. 

## 2019-06-06 NOTE — ED Provider Notes (Signed)
[facility] EMERGENCY DEPARTMENT Provider Note   CSN: [id number] Arrival date & time: 06/06/19  4982     History   Chief Complaint Chief Complaint  Patient presents with  . Nausea    HPI Kathryn Warren is a 28 y.o. female.     28 yo F with a cc of vomiting.  Patient has a history of hyperemesis gravidarum.  Was seen in this ED about a week ago for the same and was seen at [facility] about 4 days ago.  Has seen her PCP and is on a myriad of antiemetics.  Asked her which ones she has tried and said she has tried a dose of each but did not feel like they work and so stopped taking them.  Is also not doing small meals frequently.  She has some epigastric pain just post vomiting but denies any pelvic cramping pelvic pain vaginal bleeding or discharge.  Denies any urinary symptoms.  Denies fevers or cough.  The history is provided by the patient.  Illness Severity:  Moderate Onset quality:  Gradual Duration:  2 weeks Timing:  Constant Progression:  Worsening Chronicity:  New Associated symptoms: abdominal pain (just after vomiting), nausea and vomiting   Associated symptoms: no chest pain, no congestion, no fever, no headaches, no myalgias, no rhinorrhea, no shortness of breath and no wheezing     Past Medical History:  Diagnosis Date  . Diabetes mellitus without complication ([place])   . Hypertension   . Seasonal allergies     Patient Active Problem List   Diagnosis Date Noted  . Neck pain 12/15/2018  . Back pain 12/15/2018  . 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 CESAREAN SECTION    . DILATION AND CURETTAGE OF UTERUS       OB History    Gravida  4   Para      Term      Preterm      AB  1   Living        SAB      TAB      Ectopic      Multiple      Live Births               Home Medications    Prior to Admission medications   Medication Sig Start Date End  Date Taking? Authorizing Provider  glipiZIDE (GLUCOTROL) 5 MG tablet Take by mouth daily before breakfast.   Yes [provider]  ondansetron (ZOFRAN) 4 MG tablet Take 1 tablet (4 mg total) by mouth every 6 (six) hours. 05/22/19   Couture, Cortni S, PA-C  Prenatal Vit-Fe Fumarate-FA (PRENATAL COMPLETE) 14-0.4 MG TABS Take 1 tablet by mouth daily. 05/22/19 06/21/19  Couture, Cortni S, PA-C    Family History No family history on file.  Social History Social History   Tobacco Use  . Smoking status: Former Smoker    Packs/day: 1.00    Types: Cigarettes  . Smokeless tobacco: Never Used  Substance Use Topics  . Alcohol use: Yes    Comment: ocassionally-liquor  . Drug use: No     Allergies   Metformin and related   Review of Systems Review of Systems  Constitutional: Negative for chills and fever.  HENT: Negative for congestion and rhinorrhea.   Eyes: Negative for redness and visual disturbance.  Respiratory: Negative for shortness of breath and wheezing.   Cardiovascular: Negative for  chest pain and palpitations.  Gastrointestinal: Positive for abdominal pain (just after vomiting), nausea and vomiting.  Genitourinary: Negative for dysuria, urgency, vaginal bleeding and vaginal discharge.  Musculoskeletal: Negative for arthralgias and myalgias.  Skin: Negative for pallor and wound.  Neurological: Negative for dizziness and headaches.     Physical Exam Updated Vital Signs BP 124/88 (BP Location: Right Arm)   Pulse 91   Temp 98.2 F (36.8 C) (Oral)   Resp 16   Ht 6' 1" (1.854 m)   Wt 100.8 kg   LMP 04/10/2019   SpO2 98%   BMI 29.32 kg/m   Physical Exam Vitals signs and nursing note reviewed.  Constitutional:      General: She is not in acute distress.    Appearance: She is well-developed. She is not diaphoretic.  HENT:     Head: Normocephalic and atraumatic.  Eyes:     Pupils: Pupils are equal, round, and reactive to light.  Neck:      Musculoskeletal: Normal range of motion and neck supple.  Cardiovascular:     Rate and Rhythm: Normal rate and regular rhythm.     Heart sounds: No murmur. No friction rub. No gallop.   Pulmonary:     Effort: Pulmonary effort is normal.     Breath sounds: No wheezing or rales.  Abdominal:     General: There is no distension.     Palpations: Abdomen is soft.     Tenderness: There is no abdominal tenderness.  Musculoskeletal:        General: No tenderness.  Skin:    General: Skin is warm and dry.  Neurological:     Mental Status: She is alert and oriented to person, place, and time.  Psychiatric:        Behavior: Behavior normal.      ED Treatments / Results  Labs (all labs ordered are listed, but only abnormal results are displayed) Labs Reviewed  URINALYSIS, ROUTINE W REFLEX MICROSCOPIC - Abnormal; Notable for the following components:      Result Value   APPearance CLOUDY (*)    Specific Gravity, Urine >1.030 (*)    Hgb urine dipstick SMALL (*)    Bilirubin Urine MODERATE (*)    Ketones, ur >80 (*)    Protein, ur 30 (*)    All other components within normal limits  COMPREHENSIVE METABOLIC PANEL - Abnormal; Notable for the following components:   CO2 19 (*)    All other components within normal limits  URINALYSIS, MICROSCOPIC (REFLEX) - Abnormal; Notable for the following components:   Bacteria, UA MANY (*)    All other components within normal limits  CBC WITH DIFFERENTIAL/PLATELET  LIPASE, BLOOD    EKG None  Radiology No results found.  Procedures Procedures (including critical care time)  Medications Ordered in ED Medications  metoCLOPramide (REGLAN) injection 10 mg (10 mg Intravenous Given 06/06/19 0910)  diphenhydrAMINE (BENADRYL) injection 12.5 mg (12.5 mg Intravenous Given 06/06/19 0909)  sodium chloride 0.9 % bolus 1,000 mL (0 mLs Intravenous Stopped 06/06/19 1011)  famotidine (PEPCID) tablet 20 mg (20 mg Oral Given 06/06/19 1000)     Initial  Impression / Assessment and Plan / ED Course  I have reviewed the triage vital signs and the nursing notes.  Pertinent labs & imaging results that were available during my care of the patient were reviewed by me and considered in my medical decision making (see chart for details).        [place]  yo F  G4 with a cc of vomiting.  Hx of hyperemesis.  On meds at home but not taking them because she doesn't feel like they work.  Will give fluids, antiemetics.  Labwork.  UA reassess.   Patient is feeling much better.  Tolerating p.o. without difficulty.  She has a very mild metabolic acidosis without anion gap.  Likely due to dehydration with ketones greater than 80 in her urine.  She does have too numerous to count bacteria though with negative leukocyte esterase and negative nitrates I think that this is a spurious lab finding.  I discussed with her doing an eating plan as well as consistently taking the diclegis to start.  We will have her follow-up with her OB/GYN in the office.  10:21 AM:  I have discussed the diagnosis/risks/treatment options with the patient and believe the pt to be eligible for discharge home to follow-up with OB. We also discussed returning to the ED immediately if new or worsening sx occur. We discussed the sx which are most concerning (e.g., sudden worsening pain, fever, inability to tolerate by mouth) that necessitate immediate return. Medications administered to the patient during their visit and any new prescriptions provided to the patient are listed below.  Medications given during this visit Medications  metoCLOPramide (REGLAN) injection 10 mg (10 mg Intravenous Given 06/06/19 0910)  diphenhydrAMINE (BENADRYL) injection 12.5 mg (12.5 mg Intravenous Given 06/06/19 0909)  sodium chloride 0.9 % bolus 1,000 mL (0 mLs Intravenous Stopped 06/06/19 1011)  famotidine (PEPCID) tablet 20 mg (20 mg Oral Given 06/06/19 1000)     The patient appears reasonably screen and/or  stabilized for discharge and I doubt any other medical condition or other [facility] requiring further screening, evaluation, or treatment in the ED at this time prior to discharge.    Final Clinical Impressions(s) / ED Diagnoses   Final diagnoses:  Hyperemesis gravidarum    ED Discharge Orders    None       Deno Etienne, DO 06/06/19 1021

## 2019-06-06 NOTE — Discharge Instructions (Signed)
It is very important to eat frequent small meals.  The other thing that is first-line therapy is the diclegis.  Please take this as prescribed and then if you need to take the other antiemetics.  Please return to the ED for pelvic pain vaginal bleeding or an inability to eat or drink anything.

## 2019-06-06 NOTE — ED Triage Notes (Signed)
N/V since finding out she was pregnant , was eval here x 2 weeks ago. Feels weak

## 2019-06-09 ENCOUNTER — Encounter: Payer: Medicaid Other | Admitting: Plastic Surgery

## 2019-06-12 ENCOUNTER — Other Ambulatory Visit: Payer: Self-pay

## 2019-06-12 ENCOUNTER — Emergency Department (HOSPITAL_BASED_OUTPATIENT_CLINIC_OR_DEPARTMENT_OTHER)
Admission: EM | Admit: 2019-06-12 | Discharge: 2019-06-12 | Disposition: A | Discharge status: Home / Self Care | Payer: Medicaid Other | Attending: Emergency Medicine | Admitting: Emergency Medicine

## 2019-06-12 ENCOUNTER — Encounter (HOSPITAL_BASED_OUTPATIENT_CLINIC_OR_DEPARTMENT_OTHER): Payer: Self-pay | Admitting: Emergency Medicine

## 2019-06-12 DIAGNOSIS — O210 Mild hyperemesis gravidarum: Secondary | ICD-10-CM | POA: Diagnosis not present

## 2019-06-12 DIAGNOSIS — O10011 Pre-existing essential hypertension complicating pregnancy, first trimester: Secondary | ICD-10-CM | POA: Insufficient documentation

## 2019-06-12 DIAGNOSIS — Z79899 Other long term (current) drug therapy: Secondary | ICD-10-CM | POA: Insufficient documentation

## 2019-06-12 DIAGNOSIS — O219 Vomiting of pregnancy, unspecified: Secondary | ICD-10-CM | POA: Diagnosis present

## 2019-06-12 DIAGNOSIS — Z7984 Long term (current) use of oral hypoglycemic drugs: Secondary | ICD-10-CM | POA: Insufficient documentation

## 2019-06-12 DIAGNOSIS — Z3A09 9 weeks gestation of pregnancy: Secondary | ICD-10-CM | POA: Diagnosis not present

## 2019-06-12 DIAGNOSIS — Z888 Allergy status to other drugs, medicaments and biological substances status: Secondary | ICD-10-CM | POA: Insufficient documentation

## 2019-06-12 DIAGNOSIS — Z87891 Personal history of nicotine dependence: Secondary | ICD-10-CM | POA: Diagnosis not present

## 2019-06-12 DIAGNOSIS — O24111 Pre-existing diabetes mellitus, type 2, in pregnancy, first trimester: Secondary | ICD-10-CM | POA: Diagnosis not present

## 2019-06-12 LAB — COMPREHENSIVE METABOLIC PANEL
ALT: 15 U/L (ref 0–44)
AST: 16 U/L (ref 15–41)
Albumin: 3.9 g/dL (ref 3.5–5.0)
Alkaline Phosphatase: 60 U/L (ref 38–126)
Anion gap: 10 (ref 5–15)
BUN: 10 mg/dL (ref 6–20)
CO2: 22 mmol/L (ref 22–32)
Calcium: 9.1 mg/dL (ref 8.9–10.3)
Chloride: 100 mmol/L (ref 98–111)
Creatinine, Ser: 0.77 mg/dL (ref 0.44–1.00)
GFR calc Af Amer: >60 mL/min (ref >60)
GFR calc non Af Amer: >60 mL/min (ref >60)
Glucose, Bld: 200 mg/dL — ABNORMAL HIGH (ref 70–99)
Potassium: 2.9 mmol/L — ABNORMAL LOW (ref 3.5–5.1)
Sodium: 132 mmol/L — ABNORMAL LOW (ref 135–145)
Total Bilirubin: 0.4 mg/dL (ref 0.3–1.2)
Total Protein: 7.2 g/dL (ref 6.5–8.1)

## 2019-06-12 LAB — URINALYSIS, ROUTINE W REFLEX MICROSCOPIC
Glucose, UA: NEGATIVE mg/dL
Ketones, ur: 40 mg/dL — AB
Leukocytes,Ua: NEGATIVE
Nitrite: NEGATIVE
Protein, ur: 30 mg/dL — AB
Specific Gravity, Urine: 1.025 (ref 1.005–1.030)
pH: 6.5 (ref 5.0–8.0)

## 2019-06-12 LAB — CBC WITH DIFFERENTIAL/PLATELET
Abs Immature Granulocytes: 0.01 10*3/uL (ref 0.00–0.07)
Basophils Absolute: 0 10*3/uL (ref 0.0–0.1)
Basophils Relative: 0 %
Eosinophils Absolute: 0 10*3/uL (ref 0.0–0.5)
Eosinophils Relative: 1 %
HCT: 38.7 % (ref 36.0–46.0)
Hemoglobin: 12.5 g/dL (ref 12.0–15.0)
Immature Granulocytes: 0 %
Lymphocytes Relative: 37 %
Lymphs Abs: 1.8 10*3/uL (ref 0.7–4.0)
MCH: 27.7 pg (ref 26.0–34.0)
MCHC: 32.3 g/dL (ref 30.0–36.0)
MCV: 85.6 fL (ref 80.0–100.0)
Monocytes Absolute: 0.3 10*3/uL (ref 0.1–1.0)
Monocytes Relative: 6 %
Neutro Abs: 2.7 10*3/uL (ref 1.7–7.7)
Neutrophils Relative %: 56 %
Platelets: 238 10*3/uL (ref 150–400)
RBC: 4.52 MIL/uL (ref 3.87–5.11)
RDW: 14.2 % (ref 11.5–15.5)
WBC: 4.8 10*3/uL (ref 4.0–10.5)
nRBC: 0 % (ref 0.0–0.2)

## 2019-06-12 LAB — URINALYSIS, MICROSCOPIC (REFLEX)

## 2019-06-12 LAB — LIPASE, BLOOD: Lipase: 65 U/L — ABNORMAL HIGH (ref 11–51)

## 2019-06-12 MED ORDER — POTASSIUM CHLORIDE 10 MEQ/100ML IV SOLN
10.0000 meq | INTRAVENOUS | Status: AC
  Administered 2019-06-12 (×4): 10 meq via INTRAVENOUS
  Filled 2019-06-12 (×4): qty 100

## 2019-06-12 MED ORDER — SODIUM CHLORIDE 0.9 % IV SOLN
Freq: Once | INTRAVENOUS | Status: AC
  Administered 2019-06-12: 1000 mL via INTRAVENOUS

## 2019-06-12 MED ORDER — METOCLOPRAMIDE HCL 5 MG/ML IJ SOLN
10.0000 mg | Freq: Once | INTRAMUSCULAR | Status: AC
  Administered 2019-06-12: 10 mg via INTRAVENOUS
  Filled 2019-06-12: qty 2

## 2019-06-12 MED ORDER — POTASSIUM CHLORIDE ER 10 MEQ PO TBCR: 10.0000 meq | EXTENDED_RELEASE_TABLET | Freq: Every day | ORAL | 0 refills | Status: AC

## 2019-06-12 MED ORDER — ONDANSETRON HCL 4 MG/2ML IJ SOLN
4.0000 mg | Freq: Once | INTRAMUSCULAR | Status: AC
  Administered 2019-06-12: 16:00:00 4 mg via INTRAVENOUS
  Filled 2019-06-12: qty 2

## 2019-06-12 NOTE — ED Notes (Signed)
Pt placed on heart monitor 5-lead

## 2019-06-12 NOTE — ED Notes (Signed)
Pt. Has been unable to urinate while here under [facility] care

## 2019-06-12 NOTE — ED Notes (Signed)
Gave Pt ginger ale to drink

## 2019-06-12 NOTE — ED Notes (Signed)
Pt unable to provide urine sample at this time 

## 2019-06-12 NOTE — ED Triage Notes (Signed)
Pt is [redacted] weeks pregnant.  Pt is having N/V severe.  Pt states the medications she is given is not working.  Pt hasnt taken any medication today because the meds given yesterday made her more nauseated.

## 2019-06-12 NOTE — ED Notes (Addendum)
C/o n/v  meds she has at home are not helping  States not able to keep anything down x 2 weeks w no bm x 2 weeks

## 2019-06-12 NOTE — ED Provider Notes (Signed)
[place] EMERGENCY DEPARTMENT Provider Note   CSN: [id number] Arrival date & time: 06/12/19  1142     History   Chief Complaint Chief Complaint  Patient presents with  . Emesis    HPI Kathryn Warren is a 28 y.o. female with PMHx diabetes, HTN, hyperemesis gravidarum, currently [redacted] weeks pregnant ([id number]) who presents to the ED today complaining of continued N/V.  Her chart review patient initially seen in the ED on 10/16 for complaints of nausea with positive urine pregnancy test in the ED.  She was prescribed Zofran at that time and prenatal vitamins and told to follow-up with OB/GYN.  Seen at [facility] on 10/27 for same with negative work-up.  Treated with Reglan and fluids and discharged home.  Seen again on 10/31 for same.  Again negative work-up, treated with fluids and Reglan.  Patient reports that she has followed up with PCP and has been on p.o. antiemetics including Zofran, Diclegis, Reglan, Phenergan.  She reports that none of these have been helping her.  They recently switched her back to Diclegis starting yesterday.  Patient states she has taken 2 of these tablets without relief.  States that she is had 6-7 episodes of emesis today.  She reports that her OB/GYN told her to try the Diclegis and weight for follow-up until after the weekend.  Her mom reports that she brought her to the ED today due to generalized weakness and patient's inability to wait until after the weekend to see OB/GYN again.  Denies fever, chills, abdominal pain, dysuria, urinary frequency, rush of fluids, vaginal bleeding, any other associated symptoms.        Past Medical History:  Diagnosis Date  . Diabetes mellitus without complication ([place])   . Hypertension   . Seasonal allergies     Patient Active Problem List   Diagnosis Date Noted  . Neck pain 12/15/2018  . Back pain 12/15/2018  . 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 CESAREAN SECTION    . DILATION AND CURETTAGE OF UTERUS       OB History    Gravida  4   Para      Term      Preterm      AB  1   Living        SAB      TAB      Ectopic      Multiple      Live Births               Home Medications    Prior to Admission medications   Medication Sig Start Date End Date Taking? Authorizing Provider  glipiZIDE (GLUCOTROL) 5 MG tablet Take by mouth daily before breakfast.    [provider]  ondansetron (ZOFRAN) 4 MG tablet Take 1 tablet (4 mg total) by mouth every 6 (six) hours. 05/22/19   Couture, Cortni S, PA-C  potassium chloride (KLOR-CON) 10 MEQ tablet Take 1 tablet (10 mEq total) by mouth daily for 5 days. 06/12/19 06/17/19  Eustaquio Maize, PA-C  Prenatal Vit-Fe Fumarate-FA (PRENATAL COMPLETE) 14-0.4 MG TABS Take 1 tablet by mouth daily. 05/22/19 06/21/19  Couture, Cortni S, PA-C    Family History History reviewed. No pertinent family history.  Social History Social History   Tobacco Use  . Smoking status: Former Smoker    Packs/day: 1.00    Types: Cigarettes  . Smokeless tobacco: Never Used  Substance Use Topics  . Alcohol use: Yes    Comment: ocassionally-liquor  . Drug use: No     Allergies   Metformin and related   Review of Systems Review of Systems  Constitutional: Negative for chills and fever.  HENT: Negative for congestion.   Eyes: Negative for visual disturbance.  Respiratory: Negative for cough and shortness of breath.   Cardiovascular: Negative for chest pain.  Gastrointestinal: Positive for nausea and vomiting. Negative for abdominal pain.  Genitourinary: Negative for difficulty urinating, dysuria, flank pain and vaginal bleeding.  Musculoskeletal: Negative for myalgias.  Skin: Negative for rash.  Neurological: Negative for headaches.     Physical Exam Updated Vital Signs BP 130/88   Pulse (!) 102   Temp 98.5 F (36.9 C) (Oral)   Resp 16   Ht 6\' 1"   (1.854 m)   Wt 100.7 kg   LMP 04/10/2019   SpO2 99%   BMI 29.29 kg/m   Physical Exam Vitals signs and nursing note reviewed.  Constitutional:      Appearance: She is not ill-appearing or diaphoretic.     Comments: Flat affect.   HENT:     Head: Normocephalic and atraumatic.  Eyes:     Conjunctiva/sclera: Conjunctivae normal.  Neck:     Musculoskeletal: Neck supple.  Cardiovascular:     Rate and Rhythm: Normal rate and regular rhythm.     Pulses: Normal pulses.  Pulmonary:     Effort: Pulmonary effort is normal.     Breath sounds: Normal breath sounds. No wheezing, rhonchi or rales.  Abdominal:     Palpations: Abdomen is soft.     Tenderness: There is no abdominal tenderness. There is no right CVA tenderness, left CVA tenderness, guarding or rebound.  Skin:    General: Skin is warm and dry.  Neurological:     Mental Status: She is alert.      ED Treatments / Results  Labs (all labs ordered are listed, but only abnormal results are displayed) Labs Reviewed  COMPREHENSIVE METABOLIC PANEL - Abnormal; Notable for the following components:      Result Value   Sodium 132 (*)    Potassium 2.9 (*)    Glucose, Bld 200 (*)    All other components within normal limits  LIPASE, BLOOD - Abnormal; Notable for the following components:   Lipase 65 (*)    All other components within normal limits  URINALYSIS, ROUTINE W REFLEX MICROSCOPIC - Abnormal; Notable for the following components:   APPearance CLOUDY (*)    Hgb urine dipstick LARGE (*)    Bilirubin Urine SMALL (*)    Ketones, ur 40 (*)    Protein, ur 30 (*)    All other components within normal limits  URINALYSIS, MICROSCOPIC (REFLEX) - Abnormal; Notable for the following components:   Bacteria, UA FEW (*)    All other components within normal limits  CBC WITH DIFFERENTIAL/PLATELET    EKG None  Radiology No results found.  Procedures Procedures (including critical care time)  Medications Ordered in ED  Medications  potassium chloride 10 mEq in 100 mL IVPB (10 mEq Intravenous New Bag/Given 06/12/19 1717)  0.9 %  sodium chloride infusion ( Intravenous Stopped 06/12/19 1335)  metoCLOPramide (REGLAN) injection 10 mg (10 mg Intravenous Given 06/12/19 1248)  ondansetron (ZOFRAN) injection 4 mg (4 mg Intravenous Given 06/12/19 1559)     Initial Impression / Assessment and Plan / ED Course  I have reviewed the triage vital signs and  the nursing notes.  Pertinent labs & imaging results that were available during my care of the patient were reviewed by me and considered in my medical decision making (see chart for details).    28 year old female who is currently [redacted] weeks pregnant with history of hyperemesis gravidarum who presents to the ED today with continuation of nausea, vomiting.  Patient reports that she has been on numerous medications to control her nausea including Zofran, Reglan, Phenergan, Diclegis just.  Currently on Diclegis, started this again yesterday without relief.  Mom brought patient to the ED for further evaluation given she was so weak and continued to vomit.  On exam patient has a flat affect.  She is lying in the bed with covers over her head and giving short answers.  Reports she has vomited 6-7 times today.  Does appear that she has been seen in the ED multiple times in the past few weeks with continuation of nausea, vomiting.  Will obtain screening labs today and give IV fluids with Reglan and reevaluate.  This has seemed to work for patient while in the past.   Potassium 2.9.  Will replete today.  On reexam patient is sleeping comfortably.   Mom reports that patient has "vomited" while in the ED.  Does appear that patient may more so just be spitting up at this point.  Will give Zofran and reevaluate.  On exam patient is sleeping comfortably.  Mom states that she has not vomited since given the Zofran.  Aroused while speaking with mother, she states that she would like to try to  drink some diet ginger ale.  Will fluid challenge at this time and reassess.  Discussed dosing of Diclegis with patient -he reports that she is taking 1 tablet in the morning and 1 at night without relief.  Does appear that her OB/GYN medication with instructions that she could take up to 4 times per day.  If patient able to tolerate fluids in the ED and feels comfortable going home feel that she could try this at home with OB/GYN follow-up. Urine negative for infection.   Pt able to tolerate gingerale without issue. She does continue to "spit up" but no signs of active emesis. Pt feels ready to go home at this time and will try the Diclegis 4x per day given continuation of nausea. I have advised that she call her PCP on Monday morning regarding ED visit. Advised returning to the ED sooner for inability to keep fluids down with increase in Diclegis. Will also prescribe short course of KDUR given pt's potassium level here today; she had already received 4 rounds of 10 meq K in the ED today.      Final Clinical Impressions(s) / ED Diagnoses   Final diagnoses:  Hyperemesis gravidarum    ED Discharge Orders         Ordered    potassium chloride (KLOR-CON) 10 MEQ tablet  Daily     06/12/19 1749           Tanda RockersVenter, Karna Abed, PA-C 06/12/19 1809    Little, Ambrose Finlandachel Morgan, MD 06/14/19 1839

## 2019-06-12 NOTE — ED Notes (Signed)
Bedside toilet placed in room. Pt unsteady and unable to urinate at this time.

## 2019-06-12 NOTE — Discharge Instructions (Signed)
Please take 2 diclegis at night time. You can take 1 tablet in the morning and 1 tablet in the afternoon (as needed for uncontrolled nausea). Follow up with your OBGYN Dr. Micah Noel on Monday regarding your ED visit today.  Return to the ED if unable to tolerate fluids at home, continued vomiting, blood in vomit, cramping abdominal pain, vaginal bleeding, spontaneous rupture of fluids from your vagina

## 2019-06-12 NOTE — ED Notes (Addendum)
Pt. Has fluids free flowing due to only one pump being found for the room and need for potassium to flow in

## 2019-06-30 ENCOUNTER — Encounter: Payer: Medicaid Other | Admitting: Plastic Surgery

## 2019-07-08 IMAGING — CR DG CHEST 2V
2 series · 2 of 2 positions shown · non-contrast
Comparison: 10/11/2016

CLINICAL DATA: Chest pain

EXAM:
CHEST - 2 VIEW

[w chest pa]
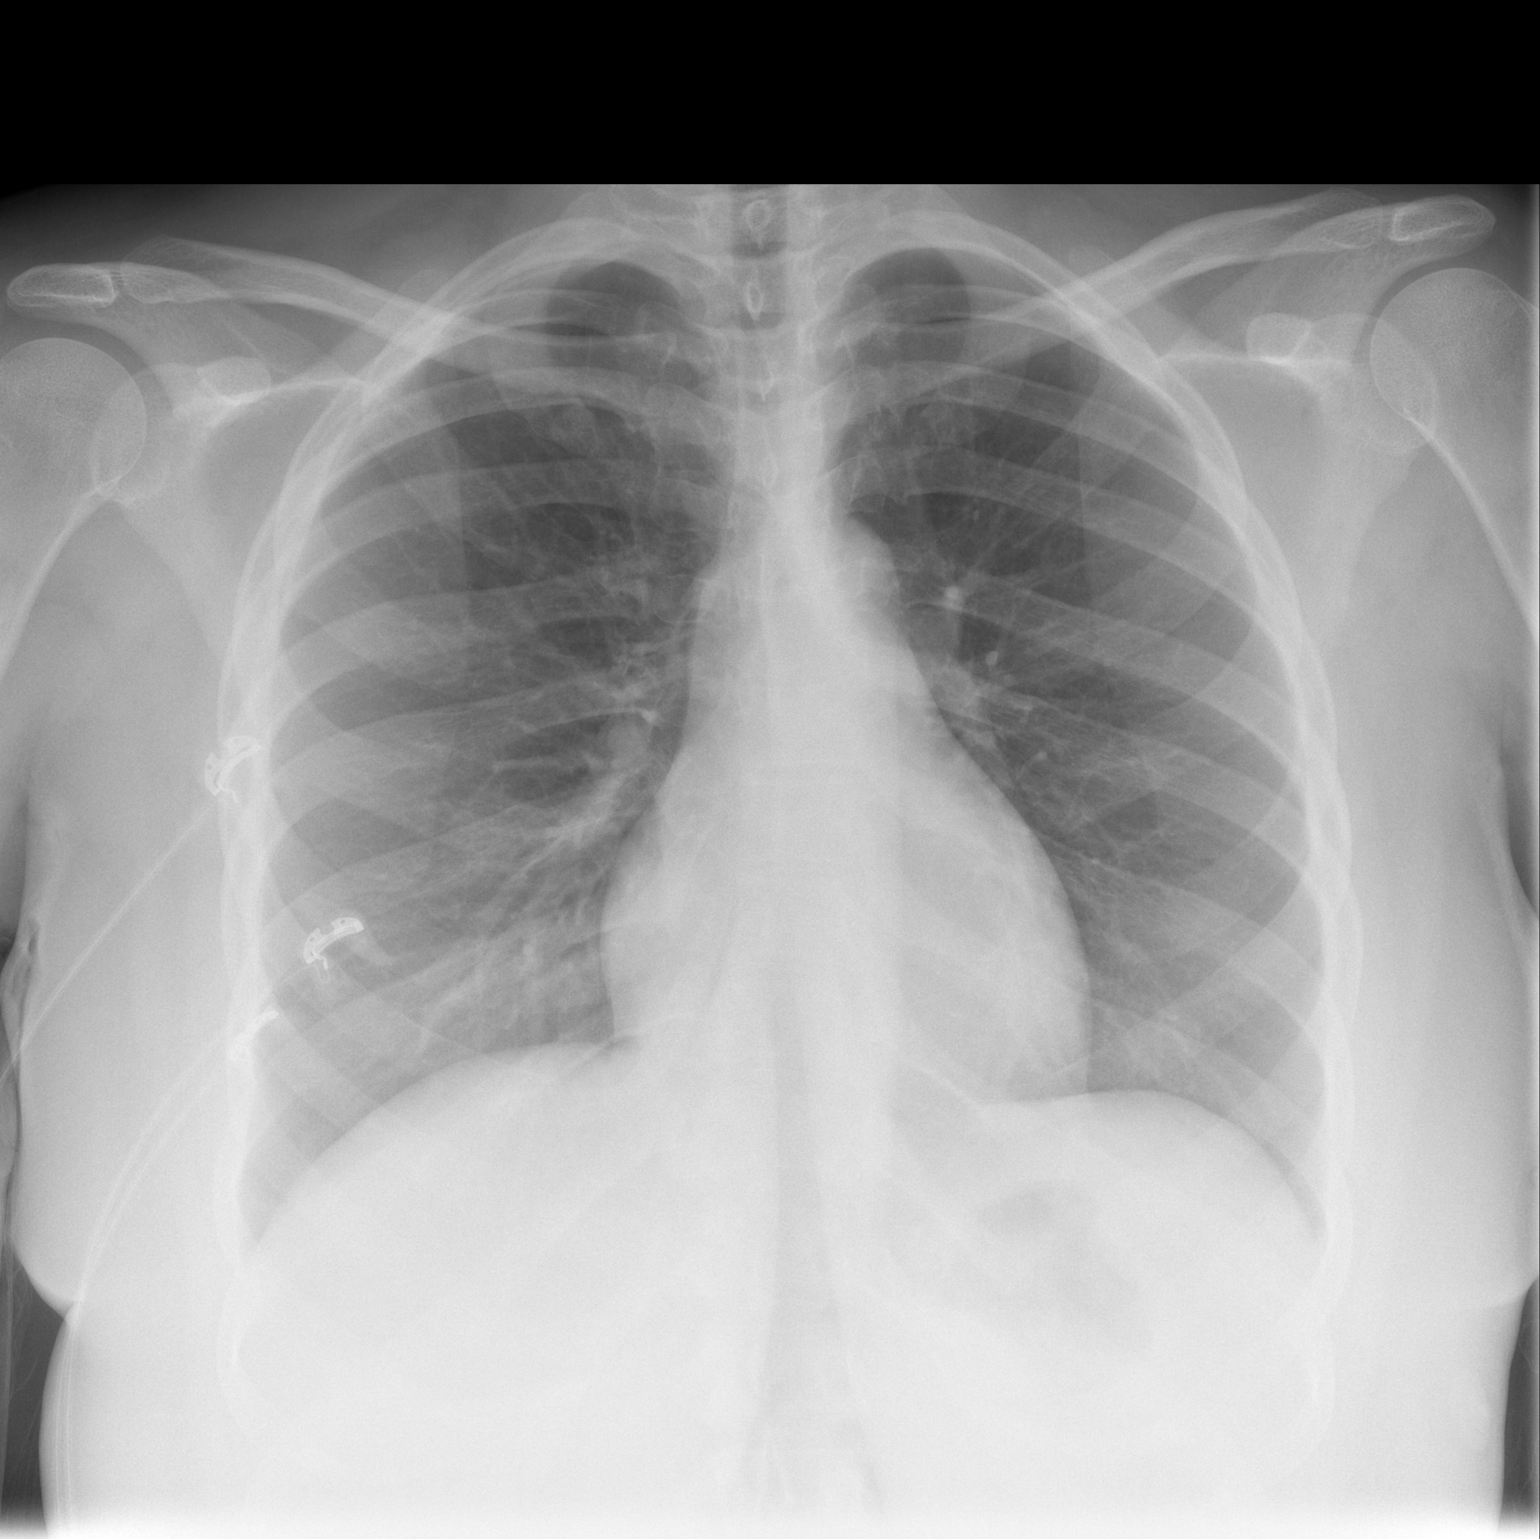

[w chest lat]
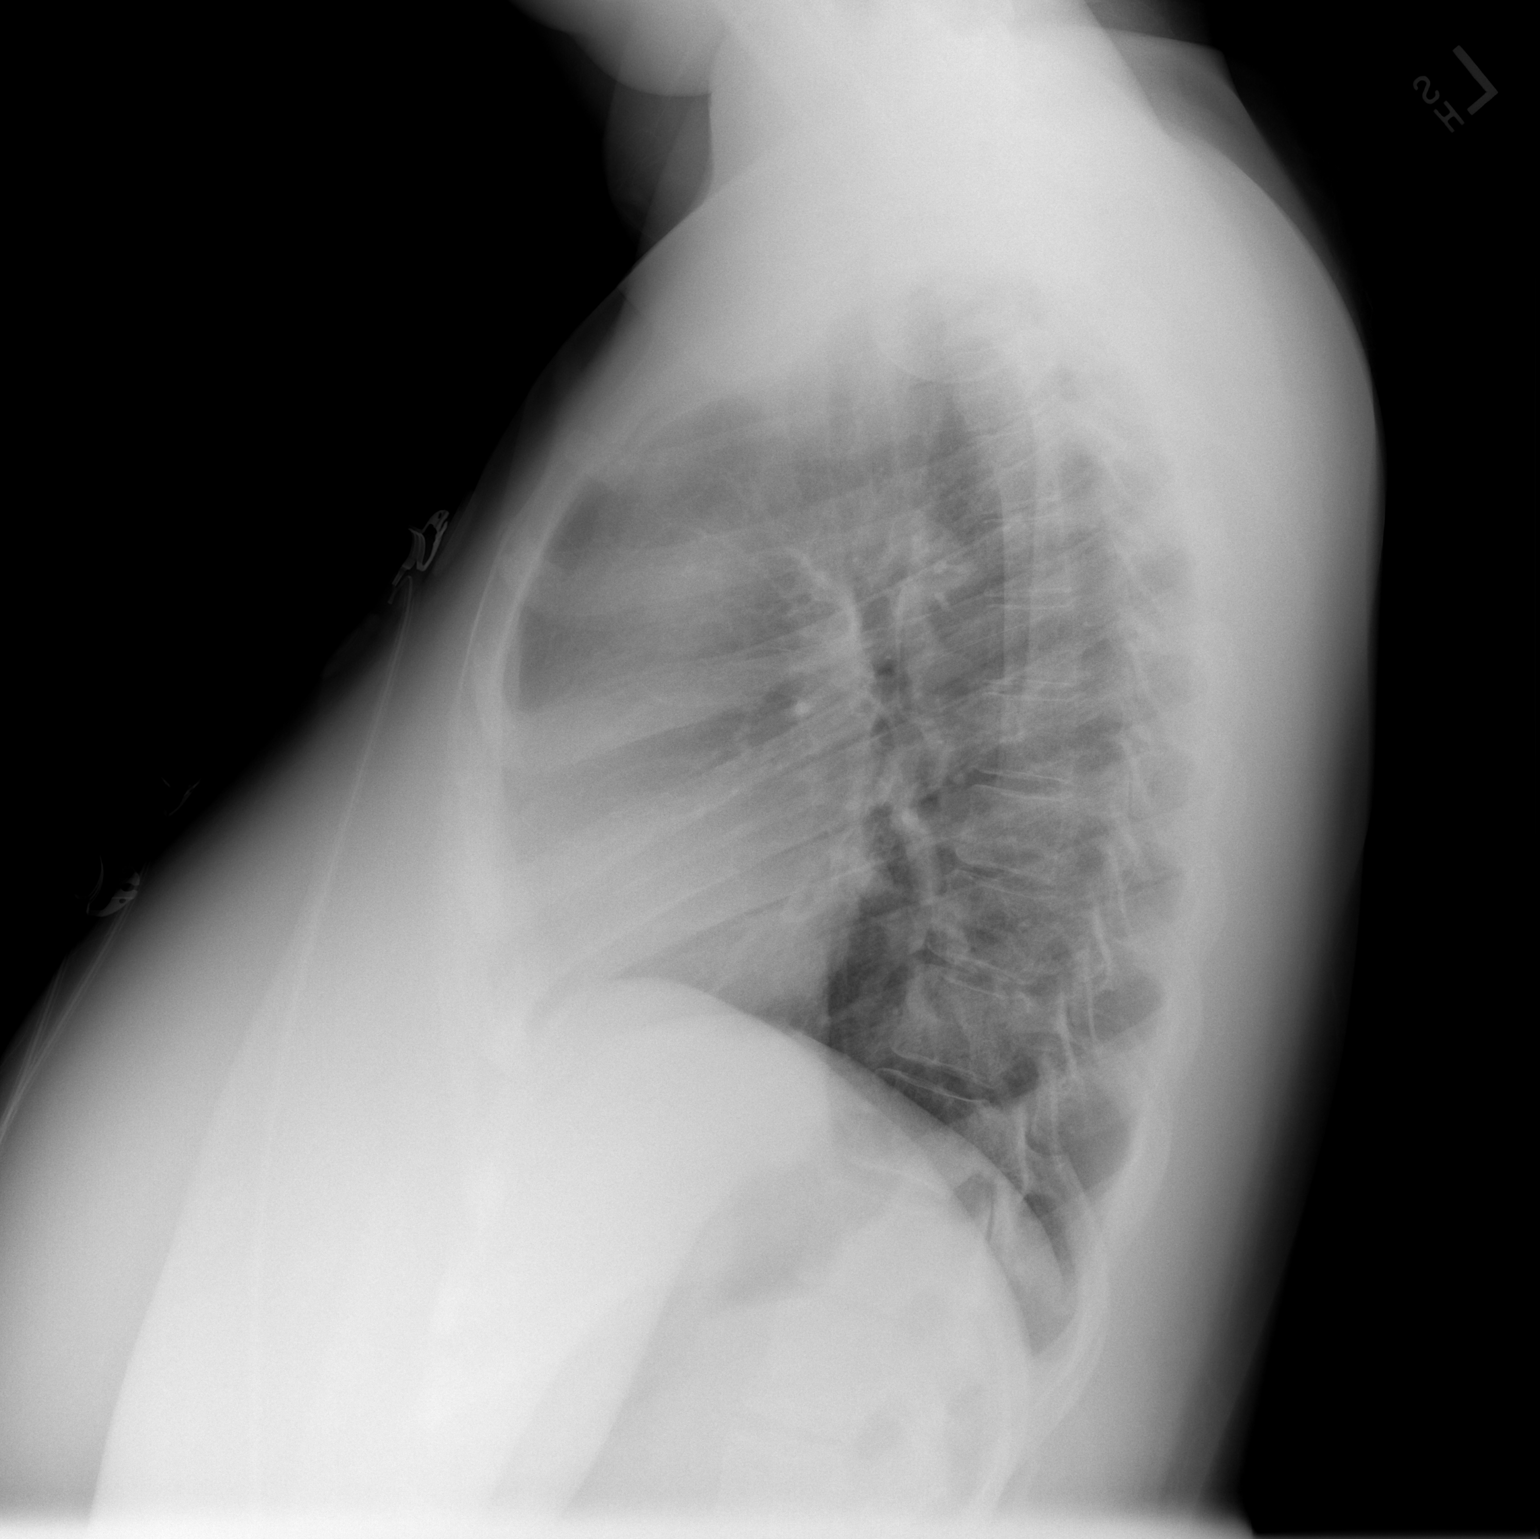

[2 of 2 positions shown; findings below may reference images not displayed]

FINDINGS: Heart and mediastinal contours are within normal limits. No focal
opacities or effusions. No acute bony abnormality.
IMPRESSION: No active cardiopulmonary disease.

## 2019-07-28 ENCOUNTER — Emergency Department (HOSPITAL_BASED_OUTPATIENT_CLINIC_OR_DEPARTMENT_OTHER)
Admission: EM | Admit: 2019-07-28 | Discharge: 2019-07-28 | Disposition: A | Discharge status: Home / Self Care | Payer: Medicaid Other | Attending: Emergency Medicine | Admitting: Emergency Medicine

## 2019-07-28 ENCOUNTER — Encounter (HOSPITAL_BASED_OUTPATIENT_CLINIC_OR_DEPARTMENT_OTHER): Payer: Self-pay

## 2019-07-28 ENCOUNTER — Other Ambulatory Visit: Payer: Self-pay

## 2019-07-28 DIAGNOSIS — Z3A15 15 weeks gestation of pregnancy: Secondary | ICD-10-CM | POA: Insufficient documentation

## 2019-07-28 DIAGNOSIS — Z87891 Personal history of nicotine dependence: Secondary | ICD-10-CM | POA: Insufficient documentation

## 2019-07-28 DIAGNOSIS — O10012 Pre-existing essential hypertension complicating pregnancy, second trimester: Secondary | ICD-10-CM | POA: Insufficient documentation

## 2019-07-28 DIAGNOSIS — O99612 Diseases of the digestive system complicating pregnancy, second trimester: Secondary | ICD-10-CM | POA: Diagnosis not present

## 2019-07-28 DIAGNOSIS — O24112 Pre-existing diabetes mellitus, type 2, in pregnancy, second trimester: Secondary | ICD-10-CM | POA: Insufficient documentation

## 2019-07-28 DIAGNOSIS — K5900 Constipation, unspecified: Secondary | ICD-10-CM | POA: Diagnosis not present

## 2019-07-28 DIAGNOSIS — Z79899 Other long term (current) drug therapy: Secondary | ICD-10-CM | POA: Insufficient documentation

## 2019-07-28 MED ORDER — METAMUCIL SMOOTH TEXTURE 58.6 % PO POWD: 1.0000 | Freq: Three times a day (TID) | ORAL | 12 refills | Status: AC

## 2019-07-28 MED ORDER — LACTULOSE 10 G PO PACK: 10.0000 g | PACK | Freq: Every day | ORAL | 0 refills | Status: AC | PRN

## 2019-07-28 MED FILL — GENERLAC 10 GM/15 ML SOLN: 10 | 2 days supply | Qty: 60 | Fill #0

## 2019-07-28 NOTE — ED Triage Notes (Signed)
Pt reports that she has been constipated for 3-4 weeks. States that she is [redacted] weeks pregnant. Was seen at [facility] and told to take stool softer and fleet enema. Reports that she "is not sure that I am doing the suppository right it keeps coming out?Marland Kitchen

## 2019-07-28 NOTE — Discharge Instructions (Signed)
Continue to stay hydrated, eat a lot of fiber, stay active and continue to take the docusate for now as you have constipation.  I have added metamucil for additional fiber to your diet and lactulose to use for refractory constipation. It is ok to use the glycerin suppositories as well if your symptoms persist.

## 2019-07-28 NOTE — ED Provider Notes (Signed)
[place] EMERGENCY DEPARTMENT Provider Note   CSN: [id number] Arrival date & time: 07/28/19  1105     History Chief Complaint  Patient presents with  . Constipation    Kathryn Warren is a 28 y.o. female.  HPI      [redacted]wk pregnant presenting with constipation  Miralax twice a day, stool softener, tried a suppository . Went to [facility] and stopped taking miralax. Tried glycerin suppository but came right out. Stool softeners every day for last week.  Trying to stay hydrated. Nausea vomiting improved after first trimester, now having constipation. No n/v.  Passing flatus. No abdominal pain. No fevers urinary symptoms.  No vaginal bleeding or leaking of fluid. Hx of constipation with past pregnancy.  Last BM was 1 week ago, small and hard stool. Feels like she needs to go and tries to have BM and can't  Past Medical History:  Diagnosis Date  . Diabetes mellitus without complication ([place])   . Hypertension   . Seasonal allergies     Patient Active Problem List   Diagnosis Date Noted  . Neck pain 12/15/2018  . Back pain 12/15/2018  . 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 CESAREAN SECTION    . DILATION AND CURETTAGE OF UTERUS       OB History    Gravida  4   Para      Term      Preterm      AB  1   Living        SAB      TAB      Ectopic      Multiple      Live Births              No family history on file.  Social History   Tobacco Use  . Smoking status: Former Smoker    Packs/day: 1.00    Types: Cigarettes  . Smokeless tobacco: Never Used  Substance Use Topics  . Alcohol use: Not Currently  . Drug use: No    Home Medications Prior to Admission medications   Medication Sig Start Date End Date Taking? Authorizing Provider  glipiZIDE (GLUCOTROL) 5 MG tablet Take by mouth daily before breakfast.    [provider]  lactulose (CEPHULAC) 10 g packet Take 1-2  packets (10-20 g total) by mouth daily as needed for up to 2 days (severe constipation). 07/28/19 07/30/19  Gareth Morgan, MD  ondansetron (ZOFRAN) 4 MG tablet Take 1 tablet (4 mg total) by mouth every 6 (six) hours. 05/22/19   Couture, Cortni S, PA-C  potassium chloride (KLOR-CON) 10 MEQ tablet Take 1 tablet (10 mEq total) by mouth daily for 5 days. 06/12/19 06/17/19  Venter, Margaux, PA-C  psyllium (METAMUCIL SMOOTH TEXTURE) 58.6 % powder Take 1 packet by mouth 3 (three) times daily. 07/28/19   Gareth Morgan, MD    Allergies    Metformin and related  Review of Systems   Review of Systems  Constitutional: Negative for fever.  Respiratory: Negative for cough.   Gastrointestinal: Positive for constipation. Negative for abdominal pain, anal bleeding, nausea and vomiting.  Genitourinary: Negative for dysuria and vaginal bleeding.  Skin: Negative for rash.    Physical Exam Updated Vital Signs BP 123/82 (BP Location: Right Arm)   Pulse 89   Temp 98 F (36.7 C) (Oral)   Resp 18   Ht 6' (1.829 m)   Wt 90.7 kg  LMP 04/10/2019   SpO2 99%   BMI 27.12 kg/m   Physical Exam Vitals and nursing note reviewed.  Constitutional:      General: She is not in acute distress.    Appearance: She is well-developed. She is not diaphoretic.  HENT:     Head: Normocephalic and atraumatic.  Eyes:     Conjunctiva/sclera: Conjunctivae normal.  Cardiovascular:     Rate and Rhythm: Normal rate and regular rhythm.  Pulmonary:     Effort: Pulmonary effort is normal. No respiratory distress.  Abdominal:     General: There is no distension.     Palpations: Abdomen is soft.     Tenderness: There is no abdominal tenderness. There is no guarding.     Comments: gravid  Musculoskeletal:        General: No tenderness.     Cervical back: Normal range of motion.  Skin:    General: Skin is warm and dry.     Findings: No erythema or rash.  Neurological:     Mental Status: She is alert and oriented to  person, place, and time.     ED Results / Procedures / Treatments   Labs (all labs ordered are listed, but only abnormal results are displayed) Labs Reviewed - No data to display  EKG None  Radiology No results found.  Procedures Procedures (including critical care time)  Medications Ordered in ED Medications - No data to display  ED Course  I have reviewed the triage vital signs and the nursing notes.  Pertinent labs & imaging results that were available during my care of the patient were reviewed by me and considered in my medical decision making (see chart for details).    MDM Rules/Calculators/A&P                      28yo female at [redacted]wk pregnant presents with concern for constipation. No sign of bowel obstruction or acute intraabdominal process.  Discussed constipation management in pregnancy, specifically hydration, fiber, diet, activity with addition of stool softener. She reports she has been doing this but has had continued constipation. Given describing refractory constipation, feel it is reasonable to try lactulose at this time. Given rx for lactulose as well as metamucil for additional fiber. Patient discharged in stable condition with understanding of reasons to return.    Final Clinical Impression(s) / ED Diagnoses Final diagnoses:  Constipation, unspecified constipation type    Rx / DC Orders ED Discharge Orders         Ordered    lactulose (CEPHULAC) 10 g packet  Daily PRN     07/28/19 1151    psyllium (METAMUCIL SMOOTH TEXTURE) 58.6 % powder  3 times daily     07/28/19 1151           Alvira Monday, MD 07/28/19 2023

## 2019-07-28 NOTE — ED Notes (Signed)
ED Provider at bedside. 

## 2020-01-14 ENCOUNTER — Encounter (HOSPITAL_BASED_OUTPATIENT_CLINIC_OR_DEPARTMENT_OTHER): Payer: Self-pay | Admitting: Emergency Medicine

## 2020-01-14 ENCOUNTER — Other Ambulatory Visit: Payer: Self-pay

## 2020-01-14 ENCOUNTER — Emergency Department (HOSPITAL_BASED_OUTPATIENT_CLINIC_OR_DEPARTMENT_OTHER)
Admission: EM | Admit: 2020-01-14 | Discharge: 2020-01-14 | Disposition: A | Discharge status: Home / Self Care | Payer: Medicaid Other | Attending: Emergency Medicine | Admitting: Emergency Medicine

## 2020-01-14 DIAGNOSIS — I10 Essential (primary) hypertension: Secondary | ICD-10-CM | POA: Diagnosis not present

## 2020-01-14 DIAGNOSIS — Y929 Unspecified place or not applicable: Secondary | ICD-10-CM | POA: Insufficient documentation

## 2020-01-14 DIAGNOSIS — E119 Type 2 diabetes mellitus without complications: Secondary | ICD-10-CM | POA: Diagnosis not present

## 2020-01-14 DIAGNOSIS — Y999 Unspecified external cause status: Secondary | ICD-10-CM | POA: Diagnosis not present

## 2020-01-14 DIAGNOSIS — Z87891 Personal history of nicotine dependence: Secondary | ICD-10-CM | POA: Insufficient documentation

## 2020-01-14 DIAGNOSIS — S3992XA Unspecified injury of lower back, initial encounter: Secondary | ICD-10-CM | POA: Diagnosis present

## 2020-01-14 DIAGNOSIS — Z7984 Long term (current) use of oral hypoglycemic drugs: Secondary | ICD-10-CM | POA: Insufficient documentation

## 2020-01-14 DIAGNOSIS — X501XXA Overexertion from prolonged static or awkward postures, initial encounter: Secondary | ICD-10-CM | POA: Diagnosis not present

## 2020-01-14 DIAGNOSIS — S39012A Strain of muscle, fascia and tendon of lower back, initial encounter: Secondary | ICD-10-CM | POA: Insufficient documentation

## 2020-01-14 DIAGNOSIS — Y9389 Activity, other specified: Secondary | ICD-10-CM | POA: Insufficient documentation

## 2020-01-14 MED ORDER — LIDOCAINE 5 % EX PTCH: 1.0000 | MEDICATED_PATCH | CUTANEOUS | 0 refills | Status: AC

## 2020-01-14 MED ORDER — OXYCODONE-ACETAMINOPHEN 5-325 MG PO TABS
1.0000 | ORAL_TABLET | Freq: Once | ORAL | Status: AC
  Administered 2020-01-14: 1 via ORAL
  Filled 2020-01-14: qty 1

## 2020-01-14 MED ORDER — KETOROLAC TROMETHAMINE 30 MG/ML IJ SOLN
30.0000 mg | Freq: Once | INTRAMUSCULAR | Status: AC
  Administered 2020-01-14: 30 mg via INTRAMUSCULAR
  Filled 2020-01-14: qty 1

## 2020-01-14 NOTE — ED Triage Notes (Signed)
R low back pain since Monday. Delivered baby via c section 6 days ago. Has been taking pain medication and muscle relaxer's without relief.

## 2020-01-14 NOTE — ED Notes (Signed)
Pt verbalizes understanding d/c instructions, follow up care and need to check out  Before leaving.

## 2020-01-14 NOTE — Discharge Instructions (Signed)
If you develop worsening, recurrent, or continued back pain, numbness or weakness in the legs, incontinence of your bowels or bladders, numbness of your buttocks, fever, abdominal pain, or any other new/concerning symptoms then return to the ER for evaluation.  

## 2020-01-14 NOTE — ED Provider Notes (Signed)
[facility] EMERGENCY DEPARTMENT Provider Note   CSN: [id number] Arrival date & time: 01/14/20  1100     History Chief Complaint  Patient presents with  . Back Pain    Kathryn Warren is a 29 y.o. female.  HPI 29 year old female presents with right low back pain.  Recently had a C-section and a couple days ago was bending down to pick something up and when she stood up she felt a sudden pain in her right low back.  Has been on ibuprofen, oxycodone, and was prescribed a muscle relaxer.  Still having pain.  Does not radiate down her legs and no weakness or numbness in her extremities.  No urine symptoms.   Past Medical History:  Diagnosis Date  . Diabetes mellitus without complication ([facility])   . Hypertension   . Seasonal allergies     Patient Active Problem List   Diagnosis Date Noted  . Neck pain 12/15/2018  . Back pain 12/15/2018  . 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 CESAREAN SECTION    . DILATION AND CURETTAGE OF UTERUS       OB History    Gravida  4   Para      Term      Preterm      AB  1   Living        SAB      TAB      Ectopic      Multiple      Live Births              No family history on file.  Social History   Tobacco Use  . Smoking status: Former Smoker    Packs/day: 1.00    Types: Cigarettes  . Smokeless tobacco: Never Used  Substance Use Topics  . Alcohol use: Not Currently  . Drug use: No    Home Medications Prior to Admission medications   Medication Sig Start Date End Date Taking? Authorizing Provider  glipiZIDE (GLUCOTROL) 5 MG tablet Take by mouth daily before breakfast.    [provider]  lidocaine (LIDODERM) 5 % Place 1 patch onto the skin daily. Remove & Discard patch within 12 hours or as directed by MD 01/14/20   Pricilla Loveless, MD  ondansetron (ZOFRAN) 4 MG tablet Take 1 tablet (4 mg total) by mouth every 6 (six) hours.  05/22/19   Couture, Cortni S, PA-C  potassium chloride (KLOR-CON) 10 MEQ tablet Take 1 tablet (10 mEq total) by mouth daily for 5 days. 06/12/19 06/17/19  Venter, Margaux, PA-C  psyllium (METAMUCIL SMOOTH TEXTURE) 58.6 % powder Take 1 packet by mouth 3 (three) times daily. 07/28/19   Alvira Monday, MD    Allergies    Metformin and related  Review of Systems   Review of Systems  Gastrointestinal: Negative for abdominal pain.  Genitourinary: Negative for dysuria.  Neurological: Negative for weakness and numbness.    Physical Exam Updated Vital Signs BP (!) 129/101 (BP Location: Right Arm)   Pulse 77   Temp 98.7 F (37.1 C) (Oral)   Resp 20   Ht 6\' 1"  (1.854 m)   Wt 110.7 kg   LMP 04/10/2019   SpO2 100%   Breastfeeding Unknown   BMI 32.19 kg/m   Physical Exam Vitals and nursing note reviewed.  Constitutional:      General: She is not in acute distress.    Appearance: She is well-developed.  She is not ill-appearing or diaphoretic.  HENT:     Head: Normocephalic and atraumatic.     Right Ear: External ear normal.     Left Ear: External ear normal.     Nose: Nose normal.  Eyes:     General:        Right eye: No discharge.        Left eye: No discharge.  Pulmonary:     Effort: Pulmonary effort is normal.  Abdominal:     Palpations: Abdomen is soft.     Tenderness: There is no abdominal tenderness.  Musculoskeletal:     Lumbar back: Tenderness present. No bony tenderness.       Back:  Skin:    General: Skin is warm and dry.  Neurological:     Mental Status: She is alert.     Comments: 5/5 strength in BLE. Normal gross sensation  Psychiatric:        Mood and Affect: Mood is not anxious.     ED Results / Procedures / Treatments   Labs (all labs ordered are listed, but only abnormal results are displayed) Labs Reviewed - No data to display  EKG None  Radiology No results found.  Procedures Procedures (including critical care time)  Medications  Ordered in ED Medications  ketorolac (TORADOL) 30 MG/ML injection 30 mg (30 mg Intramuscular Given 01/14/20 1151)  oxyCODONE-acetaminophen (PERCOCET/ROXICET) 5-325 MG per tablet 1 tablet (1 tablet Oral Given 01/14/20 1212)    ED Course  I have reviewed the triage vital signs and the nursing notes.  Pertinent labs & imaging results that were available during my care of the patient were reviewed by me and considered in my medical decision making (see chart for details).    MDM Rules/Calculators/A&P                          Patient appears to have a lumbar muscle strain.  She is already on NSAIDs, oxycodone, and muscle relaxer.  We will add Lidoderm and recommend ice/heat.  Otherwise, no neuro complaints to be suggestive of spinal cord emergency.  Will need supportive care. Final Clinical Impression(s) / ED Diagnoses Final diagnoses:  Strain of lumbar region, initial encounter    Rx / DC Orders ED Discharge Orders         Ordered    lidocaine (LIDODERM) 5 %  Every 24 hours     Discontinue  Reprint     01/14/20 [place], MD 01/14/20 1346

## 2020-06-08 ENCOUNTER — Institutional Professional Consult (permissible substitution): Payer: Medicaid Other | Admitting: Plastic Surgery

## 2020-09-27 ENCOUNTER — Ambulatory Visit: Payer: Medicaid Other | Admitting: Neurology

## 2020-09-27 ENCOUNTER — Telehealth: Payer: Self-pay | Admitting: *Deleted

## 2020-09-27 ENCOUNTER — Encounter: Payer: Self-pay | Admitting: Neurology

## 2020-09-27 NOTE — Telephone Encounter (Signed)
No showed new patient appointment. 

## 2022-10-30 ENCOUNTER — Emergency Department (HOSPITAL_BASED_OUTPATIENT_CLINIC_OR_DEPARTMENT_OTHER)
Admission: EM | Admit: 2022-10-30 | Discharge: 2022-10-30 | Disposition: A | Discharge status: Home / Self Care | Payer: Medicaid Other | Attending: Emergency Medicine | Admitting: Emergency Medicine

## 2022-10-30 ENCOUNTER — Encounter (HOSPITAL_BASED_OUTPATIENT_CLINIC_OR_DEPARTMENT_OTHER): Payer: Self-pay

## 2022-10-30 ENCOUNTER — Other Ambulatory Visit: Payer: Self-pay

## 2022-10-30 DIAGNOSIS — X500XXA Overexertion from strenuous movement or load, initial encounter: Secondary | ICD-10-CM | POA: Diagnosis not present

## 2022-10-30 DIAGNOSIS — I10 Essential (primary) hypertension: Secondary | ICD-10-CM | POA: Diagnosis not present

## 2022-10-30 DIAGNOSIS — M545 Low back pain, unspecified: Secondary | ICD-10-CM

## 2022-10-30 DIAGNOSIS — E119 Type 2 diabetes mellitus without complications: Secondary | ICD-10-CM | POA: Insufficient documentation

## 2022-10-30 DIAGNOSIS — Z7984 Long term (current) use of oral hypoglycemic drugs: Secondary | ICD-10-CM | POA: Insufficient documentation

## 2022-10-30 MED ORDER — CYCLOBENZAPRINE HCL 10 MG PO TABS: 10.0000 mg | ORAL_TABLET | Freq: Two times a day (BID) | ORAL | 0 refills | Status: AC | PRN

## 2022-10-30 MED ORDER — PREDNISONE 10 MG (21) PO TBPK: ORAL_TABLET | Freq: Every day | ORAL | 0 refills | Status: AC

## 2022-10-30 MED ORDER — OXYCODONE-ACETAMINOPHEN 5-325 MG PO TABS
1.0000 | ORAL_TABLET | Freq: Once | ORAL | Status: AC
  Administered 2022-10-30: 1 via ORAL
  Filled 2022-10-30: qty 1

## 2022-10-30 MED ORDER — HYDROCODONE-ACETAMINOPHEN 5-325 MG PO TABS: 2.0000 | ORAL_TABLET | ORAL | 0 refills | Status: AC | PRN

## 2022-10-30 MED ORDER — DEXAMETHASONE SODIUM PHOSPHATE 10 MG/ML IJ SOLN
10.0000 mg | Freq: Once | INTRAMUSCULAR | Status: AC
  Administered 2022-10-30: 10 mg via INTRAMUSCULAR
  Filled 2022-10-30: qty 1

## 2022-10-30 MED ORDER — LIDOCAINE 5 % EX PTCH
1.0000 | MEDICATED_PATCH | CUTANEOUS | Status: DC
  Administered 2022-10-30: 1 via TRANSDERMAL
  Filled 2022-10-30: qty 1

## 2022-10-30 NOTE — ED Triage Notes (Signed)
Pt reports back pain since last Wednesday. Pain lower back and initially numbness legs

## 2022-10-30 NOTE — ED Provider Notes (Signed)
[facility] Provider Note   CSN: [id number] Arrival date & time: 10/30/22  [id number]     History  Chief Complaint  Patient presents with   Back Pain    Kathryn Warren is a 32 y.o. female with a past medical history of diabetes, hypertension presents today for evaluation of low back pain.  Patient stated she started to have lower back pain on Wednesday last week.  She was lifting a heavy chair when she started to have pain in her lower back.  She reports at first she has numbness on her thighs however this has resolved.  States the pain got worse if she sits more than 5 minutes.  She has tried ibuprofen, Flexeril at home with no relief.  She went to her PCP few days ago where she was given a Toradol injection.  However she states that did not help.  She denies any urinary retention, bowel incontinence, extremity weakness.  She denies any fever, IVDU, bowel change, urinary symptoms.  LMP was 10/09/2022.   Back Pain     Past Medical History:  Diagnosis Date   Diabetes mellitus without complication ([place])    Hypertension    Seasonal allergies    Past Surgical History:  Procedure Laterality Date   CESAREAN SECTION     DILATION AND CURETTAGE OF UTERUS       Home Medications Prior to Admission medications   Medication Sig Start Date End Date Taking? Authorizing Provider  cyclobenzaprine (FLEXERIL) 10 MG tablet Take 1 tablet (10 mg total) by mouth 2 (two) times daily as needed for muscle spasms. 10/30/22  Yes Rex Kras, PA  HYDROcodone-acetaminophen (NORCO/VICODIN) 5-325 MG tablet Take 2 tablets by mouth every 4 (four) hours as needed. 10/30/22  Yes Rex Kras, PA  predniSONE (STERAPRED UNI-PAK 21 TAB) 10 MG (21) TBPK tablet Take by mouth daily. Take 6 tabs by mouth daily  for 2 days, then 5 tabs for 2 days, then 4 tabs for 2 days, then 3 tabs for 2 days, 2 tabs for 2 days, then 1 tab by mouth daily for 2 days 10/30/22  Yes Rex Kras, PA  glipiZIDE  (GLUCOTROL) 5 MG tablet Take by mouth daily before breakfast.    [provider]  lidocaine (LIDODERM) 5 % Place 1 patch onto the skin daily. Remove & Discard patch within 12 hours or as directed by MD 01/14/20   Sherwood Gambler, MD  ondansetron (ZOFRAN) 4 MG tablet Take 1 tablet (4 mg total) by mouth every 6 (six) hours. 05/22/19   Couture, Cortni S, PA-C  potassium chloride (KLOR-CON) 10 MEQ tablet Take 1 tablet (10 mEq total) by mouth daily for 5 days. 06/12/19 06/17/19  Venter, Margaux, PA-C  psyllium (METAMUCIL SMOOTH TEXTURE) 58.6 % powder Take 1 packet by mouth 3 (three) times daily. 07/28/19   Gareth Morgan, MD      Allergies    Metformin and related    Review of Systems   Review of Systems  Musculoskeletal:  Positive for back pain.    Physical Exam Updated Vital Signs BP (!) 151/96   Pulse 76   Temp (!) 97.4 F (36.3 C) (Oral)   LMP 10/09/2022   SpO2 100%  Physical Exam Vitals and nursing note reviewed.  Constitutional:      Appearance: Normal appearance.  HENT:     Head: Normocephalic and atraumatic.     Mouth/Throat:     Mouth: Mucous membranes are moist.  Eyes:  General: No scleral icterus. Cardiovascular:     Rate and Rhythm: Normal rate and regular rhythm.     Pulses: Normal pulses.     Heart sounds: Normal heart sounds.  Pulmonary:     Effort: Pulmonary effort is normal.     Breath sounds: Normal breath sounds.  Abdominal:     General: Abdomen is flat.     Palpations: Abdomen is soft.     Tenderness: There is no abdominal tenderness.  Musculoskeletal:        General: No deformity.     Comments: No lumbar midline tenderness.  There was tenderness to palpation to paraspinal lumbar area.  Skin:    General: Skin is warm.     Findings: No rash.  Neurological:     General: No focal deficit present.     Mental Status: She is alert.  Psychiatric:        Mood and Affect: Mood normal.     ED Results / Procedures / Treatments   Labs (all  labs ordered are listed, but only abnormal results are displayed) Labs Reviewed - No data to display  EKG None  Radiology No results found.  Procedures Procedures    Medications Ordered in ED Medications  lidocaine (LIDODERM) 5 % 1 patch (1 patch Transdermal Patch Applied 10/30/22 0934)  oxyCODONE-acetaminophen (PERCOCET/ROXICET) 5-325 MG per tablet 1 tablet (1 tablet Oral Given 10/30/22 0934)  dexamethasone (DECADRON) injection 10 mg (10 mg Intramuscular Given 10/30/22 1018)    ED Course/ Medical Decision Making/ A&P                             Medical Decision Making Risk Prescription drug management.   This patient presents to the ED for lower back pain, this involves an extensive number of treatment options, and is a complaint that carries with a high risk of complications and morbidity.  The differential diagnosis includes UTI, kidney stone, pregnancy, musculoskeletal pain, sciatica, cauda equina, spinal stenosis, infectious etiology.  This is not an exhaustive list.  Problem list/ ED course/ Critical interventions/ Medical management: HPI: See above Vital signs within normal range and stable throughout visit. Laboratory/imaging studies significant for: See above. On physical examination, patient is afebrile and appears in no acute distress. This patient presents with back pain most consistent with musculoskeletal pain. Differential diagnoses includes lumbago versus musculoskeletal spasm / strain versus sciatica. Less likely sciatica as straight leg raise test was negative. No back pain red flags on history or physical. Presentation not consistent with malignancy (lack of history of malignancy, lack of B symptoms), fracture (no trauma, no bony tenderness to palpation), cauda equina (no bowel or urinary incontinence/retention, no saddle anesthesia, no distal weakness), osteomyelitis or epidural abscess (no IVDU, vertebral tenderness), renal colic, pyelonephritis (afebrile, no  CVAT, no urinary symptoms). Given the clinical picture, no indication for imaging at this time.  Percocet and Decadron ordered.  Reevaluation of patient after this medication showed the patient improved.  I sent an Rx of Flexeril, Sterapred and Norco.  Advised patient to follow-up with her primary care physician for further evaluation and management, return to ER if new or worsening symptoms. I have reviewed the patient home medicines and have made adjustments as needed.  Cardiac monitoring/EKG: The patient was maintained on a cardiac monitor.  I personally reviewed and interpreted the cardiac monitor which showed an underlying rhythm of: sinus rhythm.  Additional history obtained: External records from outside source  obtained and reviewed including: Chart review including previous notes, labs, imaging.  Consultations obtained:  Disposition Continued outpatient therapy. Follow-up with PCP recommended for reevaluation of symptoms. Treatment plan discussed with patient.  Pt acknowledged understanding was agreeable to the plan. Worrisome signs and symptoms were discussed with patient, and patient acknowledged understanding to return to the ED if they noticed these signs and symptoms. Patient was stable upon discharge.   This chart was dictated using voice recognition software.  Despite best efforts to proofread,  errors can occur which can change the documentation meaning.          Final Clinical Impression(s) / ED Diagnoses Final diagnoses:  Acute right-sided low back pain without sciatica    Rx / DC Orders ED Discharge Orders          Ordered    cyclobenzaprine (FLEXERIL) 10 MG tablet  2 times daily PRN        10/30/22 1016    predniSONE (STERAPRED UNI-PAK 21 TAB) 10 MG (21) TBPK tablet  Daily        10/30/22 1016    HYDROcodone-acetaminophen (NORCO/VICODIN) 5-325 MG tablet  Every 4 hours PRN        10/30/22 1016              Rex Kras, [place] 10/30/22 1019    Malvin Johns, MD 10/30/22 1020

## 2022-10-30 NOTE — Discharge Instructions (Addendum)
Please take your medications as prescribed. Take tylenol/ibuprofen or Norco as needed for pain.  You can also try heat/ice packs for symptom relief.  I recommend close follow-up with PCP for reevaluation.  Please do not hesitate to return to emergency department if worrisome signs symptoms we discussed become apparent.

## 2022-10-30 NOTE — ED Notes (Signed)
Reviewed discharge instructions, medications and follow up with pt. Pt states understanding. Ambulatory at discharge

## 2023-11-07 ENCOUNTER — Other Ambulatory Visit: Payer: Self-pay

## 2023-11-07 ENCOUNTER — Emergency Department (HOSPITAL_BASED_OUTPATIENT_CLINIC_OR_DEPARTMENT_OTHER)

## 2023-11-07 ENCOUNTER — Encounter (HOSPITAL_BASED_OUTPATIENT_CLINIC_OR_DEPARTMENT_OTHER): Payer: Self-pay | Admitting: *Deleted

## 2023-11-07 ENCOUNTER — Emergency Department (HOSPITAL_BASED_OUTPATIENT_CLINIC_OR_DEPARTMENT_OTHER)
Admission: EM | Admit: 2023-11-07 | Discharge: 2023-11-07 | Disposition: A | Discharge status: Home / Self Care | Attending: Emergency Medicine | Admitting: Emergency Medicine

## 2023-11-07 DIAGNOSIS — R109 Unspecified abdominal pain: Secondary | ICD-10-CM | POA: Diagnosis present

## 2023-11-07 DIAGNOSIS — Z87442 Personal history of urinary calculi: Secondary | ICD-10-CM | POA: Diagnosis not present

## 2023-11-07 DIAGNOSIS — M62838 Other muscle spasm: Secondary | ICD-10-CM | POA: Diagnosis not present

## 2023-11-07 HISTORY — DX: Calculus of kidney: N20.0

## 2023-11-07 LAB — CBC WITH DIFFERENTIAL/PLATELET
Abs Immature Granulocytes: 0.02 10*3/uL (ref 0.00–0.07)
Basophils Absolute: 0 10*3/uL (ref 0.0–0.1)
Basophils Relative: 1 %
Eosinophils Absolute: 0.1 10*3/uL (ref 0.0–0.5)
Eosinophils Relative: 2 %
HCT: 35.8 % — ABNORMAL LOW (ref 36.0–46.0)
Hemoglobin: 11.3 g/dL — ABNORMAL LOW (ref 12.0–15.0)
Immature Granulocytes: 0 %
Lymphocytes Relative: 48 %
Lymphs Abs: 3.2 10*3/uL (ref 0.7–4.0)
MCH: 26.8 pg (ref 26.0–34.0)
MCHC: 31.6 g/dL (ref 30.0–36.0)
MCV: 84.8 fL (ref 80.0–100.0)
Monocytes Absolute: 0.4 10*3/uL (ref 0.1–1.0)
Monocytes Relative: 7 %
Neutro Abs: 2.7 10*3/uL (ref 1.7–7.7)
Neutrophils Relative %: 42 %
Platelets: 333 10*3/uL (ref 150–400)
RBC: 4.22 MIL/uL (ref 3.87–5.11)
RDW: 16.3 % — ABNORMAL HIGH (ref 11.5–15.5)
Smear Review: NORMAL
WBC: 6.8 10*3/uL (ref 4.0–10.5)
nRBC: 0 % (ref 0.0–0.2)

## 2023-11-07 LAB — PREGNANCY, URINE: Preg Test, Ur: NEGATIVE

## 2023-11-07 LAB — COMPREHENSIVE METABOLIC PANEL WITH GFR
ALT: 12 U/L (ref 0–44)
AST: 18 U/L (ref 15–41)
Albumin: 4.2 g/dL (ref 3.5–5.0)
Alkaline Phosphatase: 49 U/L (ref 38–126)
Anion gap: 9 (ref 5–15)
BUN: 11 mg/dL (ref 6–20)
CO2: 25 mmol/L (ref 22–32)
Calcium: 9.3 mg/dL (ref 8.9–10.3)
Chloride: 102 mmol/L (ref 98–111)
Creatinine, Ser: 0.84 mg/dL (ref 0.44–1.00)
GFR, Estimated: >60 mL/min (ref >60)
Glucose, Bld: 99 mg/dL (ref 70–99)
Potassium: 4.3 mmol/L (ref 3.5–5.1)
Sodium: 136 mmol/L (ref 135–145)
Total Bilirubin: 0.4 mg/dL (ref 0.0–1.2)
Total Protein: 7.6 g/dL (ref 6.5–8.1)

## 2023-11-07 LAB — URINALYSIS, ROUTINE W REFLEX MICROSCOPIC
Bilirubin Urine: NEGATIVE
Glucose, UA: NEGATIVE mg/dL
Ketones, ur: NEGATIVE mg/dL
Nitrite: NEGATIVE
Protein, ur: NEGATIVE mg/dL
Specific Gravity, Urine: 1.025 (ref 1.005–1.030)
pH: 7 (ref 5.0–8.0)

## 2023-11-07 LAB — URINALYSIS, MICROSCOPIC (REFLEX)

## 2023-11-07 LAB — LIPASE, BLOOD: Lipase: 26 U/L (ref 11–51)

## 2023-11-07 MED ORDER — MORPHINE SULFATE (PF) 4 MG/ML IV SOLN
4.0000 mg | Freq: Once | INTRAVENOUS | Status: AC
  Administered 2023-11-07: 4 mg via INTRAVENOUS
  Filled 2023-11-07: qty 1

## 2023-11-07 MED ORDER — METHOCARBAMOL 500 MG PO TABS: 500.0000 mg | ORAL_TABLET | Freq: Two times a day (BID) | ORAL | 0 refills | Status: AC

## 2023-11-07 MED ORDER — ETODOLAC 400 MG PO TABS
400.0000 mg | ORAL_TABLET | Freq: Two times a day (BID) | ORAL | 0 refills | Status: AC
Start: 2023-11-07 — End: ?

## 2023-11-07 MED ORDER — ONDANSETRON HCL 4 MG/2ML IJ SOLN
4.0000 mg | Freq: Once | INTRAMUSCULAR | Status: AC
  Administered 2023-11-07: 4 mg via INTRAVENOUS
  Filled 2023-11-07: qty 2

## 2023-11-07 MED ORDER — SODIUM CHLORIDE 0.9 % IV BOLUS
1000.0000 mL | Freq: Once | INTRAVENOUS | Status: AC
  Administered 2023-11-07: 1000 mL via INTRAVENOUS

## 2023-11-07 MED ORDER — LIDOCAINE 5 % EX PTCH: 1.0000 | MEDICATED_PATCH | CUTANEOUS | 0 refills | Status: AC

## 2023-11-07 NOTE — Discharge Instructions (Signed)
 Your workup today was reassuring.  CT scan did not show any concerning findings.  You likely have a muscle spasm.  Specially given your occupation and the fact that the pain gets worse with movement.  I have sent in Robaxin which is a muscle relaxer.  I will make you drowsy.  Do not drive after taking this medication.  Of also sent in Lodine which is an anti-inflammatory medication.  Also sent in Lidoderm patch.  Lodine cannot be combined with other anti-inflammatory medications.

## 2023-11-07 NOTE — ED Provider Notes (Signed)
 [facility] Provider Note   CSN: [id number] Arrival date & time: 11/07/23  1139     History  Chief Complaint  Patient presents with   Flank Pain    Kathryn Warren is a 33 y.o. female.  33 year old female presents today for concern of sudden onset of left flank pain that started at work today.  She states she has history of kidney stones.  Denies any dysuria, or gross hematuria.  Without nausea or vomiting.  No other complaints.  The history is provided by the patient. No language interpreter was used.       Home Medications Prior to Admission medications   Medication Sig Start Date End Date Taking? Authorizing Provider  cyclobenzaprine (FLEXERIL) 10 MG tablet Take 1 tablet (10 mg total) by mouth 2 (two) times daily as needed for muscle spasms. 10/30/22   Jeanelle Malling, PA  glipiZIDE (GLUCOTROL) 5 MG tablet Take by mouth daily before breakfast.    [provider]  HYDROcodone-acetaminophen (NORCO/VICODIN) 5-325 MG tablet Take 2 tablets by mouth every 4 (four) hours as needed. 10/30/22   Jeanelle Malling, PA  lidocaine (LIDODERM) 5 % Place 1 patch onto the skin daily. Remove & Discard patch within 12 hours or as directed by MD 01/14/20   Pricilla Loveless, MD  ondansetron (ZOFRAN) 4 MG tablet Take 1 tablet (4 mg total) by mouth every 6 (six) hours. 05/22/19   Couture, Cortni S, PA-C  potassium chloride (KLOR-CON) 10 MEQ tablet Take 1 tablet (10 mEq total) by mouth daily for 5 days. 06/12/19 06/17/19  Venter, Margaux, PA-C  predniSONE (STERAPRED UNI-PAK 21 TAB) 10 MG (21) TBPK tablet Take by mouth daily. Take 6 tabs by mouth daily  for 2 days, then 5 tabs for 2 days, then 4 tabs for 2 days, then 3 tabs for 2 days, 2 tabs for 2 days, then 1 tab by mouth daily for 2 days 10/30/22   Jeanelle Malling, PA  psyllium (METAMUCIL SMOOTH TEXTURE) 58.6 % powder Take 1 packet by mouth 3 (three) times daily. 07/28/19   Alvira Monday, MD      Allergies    Metformin and  related    Review of Systems   Review of Systems  Constitutional:  Negative for chills and fever.  Respiratory:  Negative for shortness of breath.   Gastrointestinal:  Negative for abdominal pain, nausea and vomiting.  Genitourinary:  Positive for flank pain. Negative for dysuria and hematuria.  Neurological:  Negative for light-headedness.  All other systems reviewed and are negative.   Physical Exam Updated Vital Signs BP (!) 180/112   Pulse 100   Temp 98.7 F (37.1 C) (Oral)   Resp 20   Ht 6\' 1"  (1.854 m)   Wt 105.2 kg   LMP 10/30/2023   SpO2 100%   BMI 30.61 kg/m  Physical Exam Vitals and nursing note reviewed.  Constitutional:      General: She is not in acute distress.    Appearance: Normal appearance. She is not ill-appearing.  HENT:     Head: Normocephalic and atraumatic.     Nose: Nose normal.  Eyes:     General: No scleral icterus.    Extraocular Movements: Extraocular movements intact.     Conjunctiva/sclera: Conjunctivae normal.  Cardiovascular:     Rate and Rhythm: Normal rate and regular rhythm.  Pulmonary:     Effort: Pulmonary effort is normal. No respiratory distress.     Breath sounds: Normal  breath sounds. No wheezing or rales.  Abdominal:     Tenderness: There is no abdominal tenderness. There is left CVA tenderness. There is no right CVA tenderness or guarding.  Musculoskeletal:        General: Normal range of motion.     Cervical back: Normal range of motion.     Comments: Cervical, thoracic, lumbar spine without tenderness to palpation.  Pain and tightness of the left lumbar paraspinal muscle region.  Skin:    General: Skin is warm and dry.  Neurological:     General: No focal deficit present.     Mental Status: She is alert. Mental status is at baseline.     ED Results / Procedures / Treatments   Labs (all labs ordered are listed, but only abnormal results are displayed) Labs Reviewed  URINALYSIS, ROUTINE W REFLEX MICROSCOPIC -  Abnormal; Notable for the following components:      Result Value   APPearance CLOUDY (*)    Hgb urine dipstick MODERATE (*)    Leukocytes,Ua TRACE (*)    All other components within normal limits  CBC WITH DIFFERENTIAL/PLATELET - Abnormal; Notable for the following components:   Hemoglobin 11.3 (*)    HCT 35.8 (*)    RDW 16.3 (*)    All other components within normal limits  URINALYSIS, MICROSCOPIC (REFLEX) - Abnormal; Notable for the following components:   Bacteria, UA RARE (*)    All other components within normal limits  PREGNANCY, URINE  COMPREHENSIVE METABOLIC PANEL WITH GFR  LIPASE, BLOOD    EKG None  Radiology No results found.  Procedures Procedures    Medications Ordered in ED Medications  ondansetron (ZOFRAN) injection 4 mg (4 mg Intravenous Given 11/07/23 1237)  sodium chloride 0.9 % bolus 1,000 mL (1,000 mLs Intravenous New Bag/Given 11/07/23 1222)  morphine (PF) 4 MG/ML injection 4 mg (4 mg Intravenous Given 11/07/23 1236)    ED Course/ Medical Decision Making/ A&P                                 Medical Decision Making Amount and/or Complexity of Data Reviewed Labs: ordered. Radiology: ordered.  Risk Prescription drug management.   Medical Decision Making / ED Course   This patient presents to the ED for concern of flank pain, this involves an extensive number of treatment options, and is a complaint that carries with it a high risk of complications and morbidity.  The differential diagnosis includes nephrolithiasis, pyelonephritis, UTI, colitis, diverticulitis, pancreatitis  MDM: 33 year old female presents today for concern of left-sided flank pain.  Started while she was at work today.  She works as a Writer.  States this feels somewhat different and worse than her previous kidney stones.  Denies any gross hematuria.  No urinary symptoms.  Labs, symptomatic management given, imaging obtained.  CBC without leukocytosis.  Mild  anemia but around her baseline.  UA without evidence of UTI.  Does show hemoglobin but this appears to be chronic.  Lipase within normal.  CMP unremarkable, pregnancy test negative.  CT without acute concerns.  I feel exam favors muscle spasm.  Will treat with Robaxin, Lodine, lidocaine patch.  Discharged in stable condition.  Return precautions discussed.  Patient voices understanding and is in agreement with plan.  Lab Tests: -I ordered, reviewed, and interpreted labs.   The pertinent results include:   Labs Reviewed  URINALYSIS, ROUTINE W REFLEX  MICROSCOPIC - Abnormal; Notable for the following components:      Result Value   APPearance CLOUDY (*)    Hgb urine dipstick MODERATE (*)    Leukocytes,Ua TRACE (*)    All other components within normal limits  CBC WITH DIFFERENTIAL/PLATELET - Abnormal; Notable for the following components:   Hemoglobin 11.3 (*)    HCT 35.8 (*)    RDW 16.3 (*)    All other components within normal limits  URINALYSIS, MICROSCOPIC (REFLEX) - Abnormal; Notable for the following components:   Bacteria, UA RARE (*)    All other components within normal limits  PREGNANCY, URINE  COMPREHENSIVE METABOLIC PANEL WITH GFR  LIPASE, BLOOD      EKG  EKG Interpretation Date/Time:    Ventricular Rate:    PR Interval:    QRS Duration:    QT Interval:    QTC Calculation:   R Axis:      Text Interpretation:           Imaging Studies ordered: I ordered imaging studies including CT renal stone study I independently visualized and interpreted imaging. I agree with the radiologist interpretation   Medicines ordered and prescription drug management: Meds ordered this encounter  Medications   ondansetron (ZOFRAN) injection 4 mg   sodium chloride 0.9 % bolus 1,000 mL   morphine (PF) 4 MG/ML injection 4 mg    -I have reviewed the patients home medicines and have made adjustments as needed  Reevaluation: After the interventions noted above, I reevaluated  the patient and found that they have :improved  Co morbidities that complicate the patient evaluation  Past Medical History:  Diagnosis Date   Diabetes mellitus without complication ([facility])    Hypertension    Kidney stone    Seasonal allergies       Dispostion: Discharged in stable condition.  Return precaution discussed.  Patient voices understanding and is in agreement with the plan.     Final Clinical Impression(s) / ED Diagnoses Final diagnoses:  Muscle spasm  Flank pain    Rx / DC Orders ED Discharge Orders          Ordered    etodolac (LODINE) 400 MG tablet  2 times daily        11/07/23 1541    methocarbamol (ROBAXIN) 500 MG tablet  2 times daily        11/07/23 1541    lidocaine (LIDODERM) 5 %  Every 24 hours        11/07/23 1541              Marita [place], PA-C 11/07/23 1617    Elayne Snare K, DO 11/08/23 [phone]

## 2023-11-07 NOTE — ED Triage Notes (Signed)
 Sudden onset severe left flank pain at 10:55, pt is in obvious discomfort in triage.  HX of kidney stones but she states that "this feels worse"
